# Patient Record
Sex: Male | Born: 1940 | Race: White | Hispanic: No | Smoking: Current every day smoker
Health system: Southern US, Community
[De-identification: ages and names within clinical notes are randomized; demographics above are authoritative.]

## PROBLEM LIST (undated history)

## (undated) DIAGNOSIS — E785 Hyperlipidemia, unspecified: Secondary | ICD-10-CM

## (undated) DIAGNOSIS — R51 Headache: Secondary | ICD-10-CM

## (undated) DIAGNOSIS — I10 Essential (primary) hypertension: Secondary | ICD-10-CM

## (undated) DIAGNOSIS — C801 Malignant (primary) neoplasm, unspecified: Secondary | ICD-10-CM

## (undated) DIAGNOSIS — L409 Psoriasis, unspecified: Secondary | ICD-10-CM

## (undated) DIAGNOSIS — I639 Cerebral infarction, unspecified: Secondary | ICD-10-CM

## (undated) HISTORY — DX: Hyperlipidemia, unspecified: E78.5

## (undated) HISTORY — DX: Cerebral infarction, unspecified: I63.9

## (undated) HISTORY — DX: Essential (primary) hypertension: I10

## (undated) HISTORY — PX: HERNIA REPAIR: SHX51

---

## 2000-01-30 DIAGNOSIS — I639 Cerebral infarction, unspecified: Secondary | ICD-10-CM

## 2000-01-30 HISTORY — DX: Cerebral infarction, unspecified: I63.9

## 2001-02-04 ENCOUNTER — Encounter: Payer: Self-pay | Admitting: Family Medicine

## 2001-02-04 ENCOUNTER — Ambulatory Visit (HOSPITAL_COMMUNITY): Admission: RE | Admit: 2001-02-04 | Discharge: 2001-02-04 | Payer: Self-pay | Admitting: Family Medicine

## 2002-03-13 ENCOUNTER — Encounter: Payer: Self-pay | Admitting: General Surgery

## 2002-03-19 ENCOUNTER — Observation Stay (HOSPITAL_COMMUNITY): Admission: RE | Admit: 2002-03-19 | Discharge: 2002-03-20 | Payer: Self-pay | Admitting: General Surgery

## 2002-06-08 ENCOUNTER — Encounter: Payer: Self-pay | Admitting: Family Medicine

## 2002-06-08 ENCOUNTER — Ambulatory Visit (HOSPITAL_COMMUNITY): Admission: RE | Admit: 2002-06-08 | Discharge: 2002-06-08 | Payer: Self-pay | Admitting: Family Medicine

## 2008-04-15 ENCOUNTER — Encounter: Payer: Self-pay | Admitting: Cardiology

## 2008-04-19 ENCOUNTER — Ambulatory Visit (HOSPITAL_COMMUNITY): Admission: RE | Admit: 2008-04-19 | Discharge: 2008-04-19 | Payer: Self-pay | Admitting: Family Medicine

## 2010-06-16 NOTE — Op Note (Signed)
NAME:  Gregory Santana, Gregory Santana NO.:  [id number]   MEDICAL RECORD NO.:  [id number]                   PATIENT TYPE:  AMB   LOCATION:  DAY                                  FACILITY:  [facility]   PHYSICIAN:  Timothy E. Earlene Plater, M.D.              DATE OF BIRTH:  10-23-1940   DATE OF PROCEDURE:  03/19/2002  DATE OF DISCHARGE:                                 OPERATIVE REPORT   PREOPERATIVE DIAGNOSIS:  Incarcerated left inguinal hernia.   POSTOPERATIVE DIAGNOSIS:  Incarcerated giant left direct inguinal hernia.   OPERATIVE PROCEDURE:  Dissection of the left groin with reduction of hernia  and repair of hernia defect.   SURGEON:  Timothy E. Earlene Plater, M.D.   ASSISTANT:  Gita Kudo, M.D.   ANESTHESIA:  CRNA supervised M.D.   INDICATIONS FOR PROCEDURE:  [name] presents for repair of inguinal  hernia.  This has been present for years.  It has been incarcerated for  years.  He has significant risk factors with cardiovascular disease, COPD,  obesity, and enormous abdominal girth.  He agrees and understands that these  risk factors are considered in the repair of the hernia, and he wishes to  proceed.  It is necessary to have a second surgeon assist on this case.   DESCRIPTION OF PROCEDURE:  The patient was identified, the permit signed,  and evaluated by anesthesia.  He was taken to the operating room, placed  supine, general endotracheal anesthesia administered.  The left groin was  further prepped, shaved, prepped, and draped in the usual fashion.  The  entire scrotum and genitalia were draped out with the left lower quadrant.  Marcaine 25% was used preoperatively.  An extended incision was made  diagonally across the left groin from the top of the scrotum to near the  iliac crest.  Subcutaneous tissue dissected and retracted.  The hernia mass  identified in the subcu space.  With two surgeons retracting and an  additional scrub tech retracting, the mass could be  separated from the  subcutaneous tissue.  The testicle was delivered up into the field with the  attached herniated mass, and by careful blunt and sharp dissection the  testicle and the cord structures were reflected laterally and bluntly  dissected from the hernia mass with success.  There appeared to be no harm  to the testicle or cord structures.  The hernia mass was then completely  dissected off the cord, back to the internal ring.  The internal ring could  be defined with proper traction and with the help there, we could actually  bluntly dissect the entire internal ring.  So with the cord retracted  laterally, the hernia sac was gently and carefully massaged.  The patient  was placed in the head-down position, and the entire herniated mass was  finally able to be placed back into the abdominal cavity without opening the  sac.  This was accomplished.  The internal ring was then further bluntly  dissected.  We elected to do a two layer mesh.  We took a piece of flat  mesh, cut and designed a wing to place beneath the internal ring and the  preperitoneal space.  This was sutured with anchor sutures of 0 Prolene.  The cord structures exited lateral to the hernia repair.  The cord was  checked, the testicle replaced into the left hemiscrotum.  Cord structures  kept laterally, and then a second piece of mesh was cut and fashioned to fit  the entire floor of the canal from the symphysis pubis along the external  oblique, along the medial aspect of the transversalis and rectus sheath, up  to and around the internal ring.  This was likewise sutured with interrupted  0 Prolene sutures and secured the final repair.  The cord structures were  viable, the testicles viable.  It was kept in the left hemiscrotum, and the  cord structures were placed in the floor of the canal.  The external oblique  closed with a running 2-0 Vicryl.  Bleeding had been controlled with the cautery, and the wound was  dry.  Irrigation was clear.  Counts were all correct.  The subcu was approximated  with 2-0 Vicryl, and the skin was closed with wide skin stapled.  Final  counts correct.  He tolerated it very well, and he was awakened, extubated,  and taken to the recovery room.                                               Timothy E. Earlene Plater, M.D.    TED/MEDQ  D:  03/19/2002  T:  03/19/2002  Job:  [id number]

## 2010-08-30 ENCOUNTER — Ambulatory Visit: Payer: Self-pay | Admitting: Cardiology

## 2010-10-05 ENCOUNTER — Ambulatory Visit: Payer: Self-pay | Admitting: Cardiology

## 2011-03-06 ENCOUNTER — Ambulatory Visit (HOSPITAL_COMMUNITY)
Admission: RE | Admit: 2011-03-06 | Discharge: 2011-03-06 | Disposition: A | Discharge status: Home / Self Care | Payer: Medicare PPO | Source: Ambulatory Visit | Attending: Family Medicine | Admitting: Family Medicine

## 2011-03-06 ENCOUNTER — Other Ambulatory Visit: Payer: Self-pay | Admitting: Family Medicine

## 2011-03-06 DIAGNOSIS — R52 Pain, unspecified: Secondary | ICD-10-CM

## 2011-03-06 DIAGNOSIS — M79609 Pain in unspecified limb: Secondary | ICD-10-CM | POA: Insufficient documentation

## 2011-03-06 DIAGNOSIS — M7989 Other specified soft tissue disorders: Secondary | ICD-10-CM | POA: Insufficient documentation

## 2011-03-08 ENCOUNTER — Other Ambulatory Visit: Payer: Self-pay | Admitting: Family Medicine

## 2011-03-08 DIAGNOSIS — M7989 Other specified soft tissue disorders: Secondary | ICD-10-CM

## 2011-03-08 DIAGNOSIS — R7989 Other specified abnormal findings of blood chemistry: Secondary | ICD-10-CM

## 2011-03-13 ENCOUNTER — Ambulatory Visit (HOSPITAL_COMMUNITY)
Admission: RE | Admit: 2011-03-13 | Discharge: 2011-03-13 | Disposition: A | Discharge status: Home / Self Care | Payer: Medicare PPO | Source: Ambulatory Visit | Attending: Family Medicine | Admitting: Family Medicine

## 2011-03-13 DIAGNOSIS — R7989 Other specified abnormal findings of blood chemistry: Secondary | ICD-10-CM

## 2011-03-13 DIAGNOSIS — M7989 Other specified soft tissue disorders: Secondary | ICD-10-CM | POA: Insufficient documentation

## 2011-03-13 DIAGNOSIS — M79609 Pain in unspecified limb: Secondary | ICD-10-CM | POA: Insufficient documentation

## 2012-05-04 ENCOUNTER — Other Ambulatory Visit: Payer: Self-pay | Admitting: Family Medicine

## 2012-06-03 ENCOUNTER — Other Ambulatory Visit: Payer: Self-pay | Admitting: Family Medicine

## 2012-06-03 NOTE — Telephone Encounter (Signed)
May refill each times 3

## 2012-06-05 ENCOUNTER — Other Ambulatory Visit: Payer: Self-pay | Admitting: Family Medicine

## 2012-06-05 ENCOUNTER — Telehealth: Payer: Self-pay | Admitting: Family Medicine

## 2012-06-05 NOTE — Telephone Encounter (Signed)
Seen in office 09/07/11

## 2012-06-05 NOTE — Telephone Encounter (Signed)
Patient needs a refill of his Ativan to [facility], today please

## 2012-06-06 NOTE — Telephone Encounter (Signed)
Med called into [facility]. Patient notified and will call back to schedule office visit.

## 2012-06-06 NOTE — Telephone Encounter (Signed)
Ok times 1 needs ov 

## 2012-06-06 NOTE — Telephone Encounter (Signed)
Refill x1 he needs an office visit

## 2012-06-11 ENCOUNTER — Encounter: Payer: Self-pay | Admitting: *Deleted

## 2012-07-02 ENCOUNTER — Ambulatory Visit (INDEPENDENT_AMBULATORY_CARE_PROVIDER_SITE_OTHER): Payer: Medicare PPO | Admitting: Family Medicine

## 2012-07-02 ENCOUNTER — Encounter: Payer: Self-pay | Admitting: Family Medicine

## 2012-07-02 VITALS — BP 119/77 | HR 80 | Wt 273.0 lb

## 2012-07-02 DIAGNOSIS — R0989 Other specified symptoms and signs involving the circulatory and respiratory systems: Secondary | ICD-10-CM

## 2012-07-02 DIAGNOSIS — I10 Essential (primary) hypertension: Secondary | ICD-10-CM | POA: Insufficient documentation

## 2012-07-02 DIAGNOSIS — L408 Other psoriasis: Secondary | ICD-10-CM

## 2012-07-02 DIAGNOSIS — E785 Hyperlipidemia, unspecified: Secondary | ICD-10-CM

## 2012-07-02 DIAGNOSIS — L409 Psoriasis, unspecified: Secondary | ICD-10-CM | POA: Insufficient documentation

## 2012-07-02 MED ORDER — IBUPROFEN 600 MG PO TABS: 600.0000 mg | ORAL_TABLET | Freq: Four times a day (QID) | ORAL | Status: DC | PRN

## 2012-07-02 MED ORDER — LISINOPRIL 5 MG PO TABS: 5.0000 mg | ORAL_TABLET | Freq: Every day | ORAL | Status: DC

## 2012-07-02 MED ORDER — ATORVASTATIN CALCIUM 20 MG PO TABS: 20.0000 mg | ORAL_TABLET | Freq: Every day | ORAL | Status: DC

## 2012-07-02 MED ORDER — TAMSULOSIN HCL 0.4 MG PO CAPS: 0.4000 mg | ORAL_CAPSULE | Freq: Every day | ORAL | Status: DC

## 2012-07-02 MED ORDER — LORAZEPAM 0.5 MG PO TABS: 0.5000 mg | ORAL_TABLET | Freq: Every evening | ORAL | Status: DC | PRN

## 2012-07-02 MED ORDER — IBUPROFEN 200 MG PO TABS: ORAL_TABLET | ORAL | Status: DC

## 2012-07-02 MED ORDER — MOMETASONE FUROATE 0.1 % EX CREA: TOPICAL_CREAM | CUTANEOUS | Status: DC

## 2012-07-02 NOTE — Progress Notes (Signed)
Pt has app [facility] July 08, 2012 1000 for carotid u/s

## 2012-07-02 NOTE — Patient Instructions (Signed)
Cut down on BP medicine- use 1/2 daily  Lisinopril-HCTZ 10-12.5  Carotid ultrasound in near future

## 2012-07-02 NOTE — Progress Notes (Signed)
  Subjective:    Patient ID: Gregory Santana, male    DOB: 1941-01-12, 72 y.o.   MRN: [id number]  Rash  Noticed lesions on abdomen,back, right and left legs 3 months ago. Began small 3 months ago and has worsened. Pt states itches and bleeds when scratching.   Patient states that his son has psoriasis he also states he has also noticed increased scaling in his scalp as well.  Patient has history hypertension he relates that when he gets up he feels dizzy and woozy this been present over the past few months he has had some intermittent dizziness has a history of a stroke and carotid artery disease as well as hypertension  He does try to keep healthy and tries to watch her fats in his diet to some degree but he does not exercise in he smokes approximately a pack and a half a day. He states that he would like to quit but he really at the same time doesn't think he is going to be able to.  PMH  Psoriasis, hypertension, stroke, heart disease, hyperlipidemia Family history noncontributory Social smokes   Review of Systems  Skin: Positive for rash.  Denies chest pressure tightness pain and swelling in the legs denies fevers chills wheezing.     Objective:   Physical Exam  Lungs are clear, heart is regular, pulses normal, abdomen is soft psoriasis noted on the scan carotid bruit worse on the left than the right extremities no edema skin warm dry      Assessment & Plan:  Psoriasis-Elocon cream twice a day when necessary also use medicated shampoo over-the-counter Hypertension-medications slightly strong for him. Reduce lisinopril 5 mg 1 daily. Patient's blood pressure does drop dramatically from sitting to standing. Followup again in approximately 3-4 months Tobacco abuse advised patient to quit smoking counseled him to do so Hyperlipidemia check lipid profile continue medication watch diet Insomnia -- she does well with lorazepam at nighttime continue this BPH does well with Flomax  continue this. Carotid bruit-carotid ultrasound ordered. Await results.

## 2012-07-08 ENCOUNTER — Ambulatory Visit (HOSPITAL_COMMUNITY)
Admission: RE | Admit: 2012-07-08 | Discharge: 2012-07-08 | Disposition: A | Discharge status: Home / Self Care | Payer: Medicare PPO | Source: Ambulatory Visit | Attending: Family Medicine | Admitting: Family Medicine

## 2012-07-08 DIAGNOSIS — R0989 Other specified symptoms and signs involving the circulatory and respiratory systems: Secondary | ICD-10-CM | POA: Insufficient documentation

## 2012-07-08 DIAGNOSIS — I6529 Occlusion and stenosis of unspecified carotid artery: Secondary | ICD-10-CM | POA: Insufficient documentation

## 2012-07-08 LAB — LIPID PANEL: LDL Cholesterol: 41 mg/dL (ref 0–99)

## 2012-07-08 LAB — BASIC METABOLIC PANEL
BUN: 29 mg/dL — ABNORMAL HIGH (ref 6–23)
CO2: 27 mEq/L (ref 19–32)
Calcium: 9.3 mg/dL (ref 8.4–10.5)
Chloride: 104 mEq/L (ref 96–112)
Creat: 1.58 mg/dL — ABNORMAL HIGH (ref 0.50–1.35)

## 2012-10-08 ENCOUNTER — Ambulatory Visit: Payer: Medicare PPO | Admitting: Family Medicine

## 2012-12-02 ENCOUNTER — Other Ambulatory Visit: Payer: Self-pay | Admitting: Family Medicine

## 2012-12-03 NOTE — Telephone Encounter (Signed)
1 refill, he needs a followup office visit!

## 2013-01-03 ENCOUNTER — Other Ambulatory Visit: Payer: Self-pay | Admitting: Family Medicine

## 2013-01-05 ENCOUNTER — Encounter: Payer: Self-pay | Admitting: Family Medicine

## 2013-01-05 ENCOUNTER — Ambulatory Visit (INDEPENDENT_AMBULATORY_CARE_PROVIDER_SITE_OTHER): Payer: Medicare PPO | Admitting: Family Medicine

## 2013-01-05 VITALS — BP 160/88 | Temp 98.0°F | Ht 73.0 in | Wt 273.0 lb

## 2013-01-05 DIAGNOSIS — N320 Bladder-neck obstruction: Secondary | ICD-10-CM

## 2013-01-05 DIAGNOSIS — Z125 Encounter for screening for malignant neoplasm of prostate: Secondary | ICD-10-CM

## 2013-01-05 DIAGNOSIS — R350 Frequency of micturition: Secondary | ICD-10-CM

## 2013-01-05 DIAGNOSIS — Z79899 Other long term (current) drug therapy: Secondary | ICD-10-CM

## 2013-01-05 MED ORDER — DOXAZOSIN MESYLATE 4 MG PO TABS: 4.0000 mg | ORAL_TABLET | Freq: Every day | ORAL | Status: DC

## 2013-01-05 NOTE — Progress Notes (Signed)
   Subjective:    Patient ID: AARISH ROCKERS, male    DOB: 15-Aug-1940, 72 y.o.   MRN: [id number]  HPIFrequent urination for the past 2-3 weeks. Was seen a few months ago for this. Stopped taking flomax because he didn't see where it was helping.  He has noticed this problem over the past several weeks. Some constipation as well. He has been sleeping in a recliner. Patient stopped taking Flomax was he didn't think it was helping. Review of Systems He denies high fevers denies wheezing difficulty breathing denies nausea vomiting diarrhea.    Objective:   Physical Exam  Lungs are clear hearts regular pulse normal blood pressure mildly elevated. 154/86. Extremities some trace edema.      Assessment & Plan:  Patient with constipation stool softeners-we'll discuss this further on followup. May need referral for colonoscopy  HTN start Cardura 4 mg each bedtime  BPH-prostate felt fine. He will bring by a urine for [place] to check. PSA ordered along with kidney functions. Referral to [facility]. May need to have prostate resection done.

## 2013-01-06 ENCOUNTER — Other Ambulatory Visit: Payer: Self-pay | Admitting: *Deleted

## 2013-01-06 DIAGNOSIS — R350 Frequency of micturition: Secondary | ICD-10-CM

## 2013-01-06 LAB — POCT URINALYSIS DIPSTICK: Spec Grav, UA: 1.025

## 2013-01-06 LAB — BASIC METABOLIC PANEL
CO2: 27 mEq/L (ref 19–32)
Chloride: 104 mEq/L (ref 96–112)
Glucose, Bld: 92 mg/dL (ref 70–99)
Potassium: 4.8 mEq/L (ref 3.5–5.3)
Sodium: 143 mEq/L (ref 135–145)

## 2013-01-06 NOTE — Progress Notes (Signed)
Patient notified

## 2013-01-06 NOTE — Addendum Note (Signed)
Addended by: Metro Kung on: 01/06/2013 04:20 PM   Modules accepted: Orders

## 2013-01-07 ENCOUNTER — Other Ambulatory Visit: Payer: Self-pay | Admitting: *Deleted

## 2013-01-07 MED ORDER — CIPROFLOXACIN HCL 500 MG PO TABS: 500.0000 mg | ORAL_TABLET | Freq: Two times a day (BID) | ORAL | Status: DC

## 2013-01-07 NOTE — Progress Notes (Signed)
Patient's daughter notified.

## 2013-01-07 NOTE — Progress Notes (Signed)
[facility] 12/10 (Cipro was ordered)

## 2013-01-10 LAB — URINE CULTURE: Colony Count: >=100000

## 2013-01-14 ENCOUNTER — Encounter: Payer: Self-pay | Admitting: Family Medicine

## 2013-01-14 ENCOUNTER — Telehealth: Payer: Self-pay | Admitting: Family Medicine

## 2013-01-14 NOTE — Telephone Encounter (Signed)
Patients daughter cancelled appt for Monday because he is going to see the specialist for his issue. His daughter wants to know if he still needs to followup with [place]?

## 2013-01-14 NOTE — Telephone Encounter (Signed)
Discussed with daughter. She canceled Monday's appt and she will call back to schedule office visit in January.

## 2013-01-14 NOTE — Telephone Encounter (Signed)
The cardura is to help his urine flow and HTN, he can cancel Mondays OV BUT I rec ov in Jan to recheck BP

## 2013-01-19 ENCOUNTER — Ambulatory Visit: Payer: Medicare PPO | Admitting: Family Medicine

## 2013-01-21 ENCOUNTER — Encounter: Payer: Self-pay | Admitting: Family Medicine

## 2013-01-21 DIAGNOSIS — C679 Malignant neoplasm of bladder, unspecified: Secondary | ICD-10-CM | POA: Insufficient documentation

## 2013-01-29 DIAGNOSIS — L409 Psoriasis, unspecified: Secondary | ICD-10-CM

## 2013-01-29 HISTORY — DX: Psoriasis, unspecified: L40.9

## 2013-01-31 ENCOUNTER — Other Ambulatory Visit: Payer: Self-pay | Admitting: Family Medicine

## 2013-02-02 ENCOUNTER — Other Ambulatory Visit: Payer: Self-pay | Admitting: Urology

## 2013-02-03 ENCOUNTER — Encounter (HOSPITAL_COMMUNITY): Payer: Self-pay | Admitting: Pharmacy Technician

## 2013-02-04 ENCOUNTER — Encounter (HOSPITAL_COMMUNITY): Payer: Self-pay

## 2013-02-04 ENCOUNTER — Other Ambulatory Visit (HOSPITAL_COMMUNITY): Payer: Self-pay | Admitting: Urology

## 2013-02-04 NOTE — Patient Instructions (Addendum)
[place]  02/04/2013   Your procedure is scheduled on:  02/06/13 FRIDAY  Report to [facility] at   0900    AM.  Call this number if you have problems the morning of surgery: [phone]       Remember:   Do not eat food  Or drink :After Midnight.THURSDAY NIGHT   Take these medicines the morning of surgery with A SIP OF WATER: NONE   .  Contacts, dentures or partial plates can not be worn to surgery  Leave suitcase in the car. After surgery it may be brought to your room.  For patients admitted to the hospital, checkout time is 11:00 AM day of  discharge.             SPECIAL INSTRUCTIONS- SEE [facility] PREPARING FOR SURGERY INSTRUCTION SHEET-     DO NOT WEAR JEWELRY, LOTIONS, POWDERS, OR PERFUMES.  WOMEN-- DO NOT SHAVE LEGS OR UNDERARMS FOR 12 HOURS BEFORE SHOWERS. MEN MAY SHAVE FACE.  Patients discharged the day of surgery will not be allowed to drive home. IF going home the day of surgery, you must have a driver and someone to stay with you for the first 24 hours  Name and phone number of your driver:    Overnight stay                                                                                                                  Shaunie Boehm  PST [phone]                 FAILURE TO [place]                                                  Patient Signature _____________________________

## 2013-02-05 ENCOUNTER — Encounter (HOSPITAL_COMMUNITY): Payer: Self-pay

## 2013-02-05 ENCOUNTER — Encounter (HOSPITAL_COMMUNITY)
Admission: RE | Admit: 2013-02-05 | Discharge: 2013-02-05 | Disposition: A | Discharge status: Home / Self Care | Payer: Medicare PPO | Source: Ambulatory Visit | Attending: Urology | Admitting: Urology

## 2013-02-05 ENCOUNTER — Ambulatory Visit (HOSPITAL_COMMUNITY)
Admission: RE | Admit: 2013-02-05 | Discharge: 2013-02-05 | Disposition: A | Discharge status: Home / Self Care | Payer: Medicare PPO | Source: Ambulatory Visit | Attending: Urology | Admitting: Urology

## 2013-02-05 HISTORY — DX: Malignant (primary) neoplasm, unspecified: C80.1

## 2013-02-05 HISTORY — DX: Headache: R51

## 2013-02-05 HISTORY — DX: Psoriasis, unspecified: L40.9

## 2013-02-05 LAB — CBC
HCT: 40.1 % (ref 39.0–52.0)
Hemoglobin: 13.7 g/dL (ref 13.0–17.0)
MCH: 31.9 pg (ref 26.0–34.0)
MCHC: 34.2 g/dL (ref 30.0–36.0)
MCV: 93.5 fL (ref 78.0–100.0)
PLATELETS: 350 10*3/uL (ref 150–400)
RBC: 4.29 MIL/uL (ref 4.22–5.81)
RDW: 12.6 % (ref 11.5–15.5)
WBC: 13.4 10*3/uL — ABNORMAL HIGH (ref 4.0–10.5)

## 2013-02-05 LAB — BASIC METABOLIC PANEL
BUN: 25 mg/dL — ABNORMAL HIGH (ref 6–23)
CALCIUM: 9.8 mg/dL (ref 8.4–10.5)
CO2: 26 mEq/L (ref 19–32)
CREATININE: 1.93 mg/dL — AB (ref 0.50–1.35)
Chloride: 102 mEq/L (ref 96–112)
GFR calc Af Amer: 39 mL/min — ABNORMAL LOW (ref >90)
GFR calc non Af Amer: 34 mL/min — ABNORMAL LOW (ref >90)
GLUCOSE: 119 mg/dL — AB (ref 70–99)
Potassium: 4.9 mEq/L (ref 3.7–5.3)
Sodium: 138 mEq/L (ref 137–147)

## 2013-02-05 MED ORDER — DEXTROSE 5 % IV SOLN
1.0000 g | Freq: Once | INTRAVENOUS | Status: AC
  Administered 2013-02-06: 1 g via INTRAVENOUS
  Filled 2013-02-05 (×2): qty 10

## 2013-02-05 NOTE — Progress Notes (Signed)
Faxed labs to Dr Tresa Moore via [facility]

## 2013-02-05 NOTE — Progress Notes (Signed)
EKG reviewed by Dr  Marcell Barlow- "OK"

## 2013-02-06 ENCOUNTER — Encounter (HOSPITAL_COMMUNITY): Payer: Self-pay | Admitting: *Deleted

## 2013-02-06 ENCOUNTER — Inpatient Hospital Stay (HOSPITAL_COMMUNITY)
Admission: RE | Admit: 2013-02-06 | Discharge: 2013-02-09 | DRG: 669 | Disposition: A | Discharge status: Home / Self Care | Payer: Medicare PPO | Source: Ambulatory Visit | Attending: Urology | Admitting: Urology

## 2013-02-06 ENCOUNTER — Ambulatory Visit (HOSPITAL_COMMUNITY): Payer: Medicare PPO | Admitting: Certified Registered Nurse Anesthetist

## 2013-02-06 ENCOUNTER — Encounter (HOSPITAL_COMMUNITY)
Admission: RE | Disposition: A | Discharge status: Home / Self Care | Payer: Self-pay | Source: Ambulatory Visit | Attending: Urology

## 2013-02-06 ENCOUNTER — Encounter (HOSPITAL_COMMUNITY): Payer: Medicare PPO | Admitting: Certified Registered Nurse Anesthetist

## 2013-02-06 DIAGNOSIS — Z8673 Personal history of transient ischemic attack (TIA), and cerebral infarction without residual deficits: Secondary | ICD-10-CM

## 2013-02-06 DIAGNOSIS — N1330 Unspecified hydronephrosis: Secondary | ICD-10-CM | POA: Diagnosis present

## 2013-02-06 DIAGNOSIS — F17200 Nicotine dependence, unspecified, uncomplicated: Secondary | ICD-10-CM | POA: Diagnosis present

## 2013-02-06 DIAGNOSIS — E669 Obesity, unspecified: Secondary | ICD-10-CM | POA: Diagnosis present

## 2013-02-06 DIAGNOSIS — L408 Other psoriasis: Secondary | ICD-10-CM | POA: Diagnosis present

## 2013-02-06 DIAGNOSIS — I10 Essential (primary) hypertension: Secondary | ICD-10-CM | POA: Diagnosis present

## 2013-02-06 DIAGNOSIS — R3989 Other symptoms and signs involving the genitourinary system: Secondary | ICD-10-CM | POA: Diagnosis present

## 2013-02-06 DIAGNOSIS — Z01812 Encounter for preprocedural laboratory examination: Secondary | ICD-10-CM

## 2013-02-06 DIAGNOSIS — Z01818 Encounter for other preprocedural examination: Secondary | ICD-10-CM

## 2013-02-06 DIAGNOSIS — E785 Hyperlipidemia, unspecified: Secondary | ICD-10-CM | POA: Diagnosis present

## 2013-02-06 DIAGNOSIS — Z01810 Encounter for preprocedural cardiovascular examination: Secondary | ICD-10-CM

## 2013-02-06 DIAGNOSIS — D649 Anemia, unspecified: Secondary | ICD-10-CM | POA: Diagnosis present

## 2013-02-06 DIAGNOSIS — R39198 Other difficulties with micturition: Secondary | ICD-10-CM | POA: Diagnosis present

## 2013-02-06 DIAGNOSIS — N138 Other obstructive and reflux uropathy: Secondary | ICD-10-CM | POA: Diagnosis present

## 2013-02-06 DIAGNOSIS — Z8249 Family history of ischemic heart disease and other diseases of the circulatory system: Secondary | ICD-10-CM

## 2013-02-06 DIAGNOSIS — N401 Enlarged prostate with lower urinary tract symptoms: Secondary | ICD-10-CM | POA: Diagnosis present

## 2013-02-06 DIAGNOSIS — C679 Malignant neoplasm of bladder, unspecified: Principal | ICD-10-CM | POA: Diagnosis present

## 2013-02-06 DIAGNOSIS — Z6834 Body mass index (BMI) 34.0-34.9, adult: Secondary | ICD-10-CM

## 2013-02-06 DIAGNOSIS — N3941 Urge incontinence: Secondary | ICD-10-CM | POA: Diagnosis present

## 2013-02-06 DIAGNOSIS — R310 Gross hematuria: Secondary | ICD-10-CM | POA: Diagnosis present

## 2013-02-06 HISTORY — PX: CYSTOSCOPY W/ URETERAL STENT PLACEMENT: SHX1429

## 2013-02-06 HISTORY — PX: CYSTOGRAM: SHX6285

## 2013-02-06 HISTORY — PX: TRANSURETHRAL RESECTION OF BLADDER TUMOR WITH GYRUS (TURBT-GYRUS): SHX6458

## 2013-02-06 LAB — BASIC METABOLIC PANEL
BUN: 26 mg/dL — AB (ref 6–23)
CHLORIDE: 104 meq/L (ref 96–112)
CO2: 22 mEq/L (ref 19–32)
Calcium: 8.1 mg/dL — ABNORMAL LOW (ref 8.4–10.5)
Creatinine, Ser: 2.05 mg/dL — ABNORMAL HIGH (ref 0.50–1.35)
GFR calc Af Amer: 36 mL/min — ABNORMAL LOW (ref >90)
GFR calc non Af Amer: 31 mL/min — ABNORMAL LOW (ref >90)
GLUCOSE: 152 mg/dL — AB (ref 70–99)
POTASSIUM: 4.7 meq/L (ref 3.7–5.3)
Sodium: 139 mEq/L (ref 137–147)

## 2013-02-06 LAB — HEMOGLOBIN AND HEMATOCRIT, BLOOD
HCT: 30.2 % — ABNORMAL LOW (ref 39.0–52.0)
Hemoglobin: 10 g/dL — ABNORMAL LOW (ref 13.0–17.0)

## 2013-02-06 LAB — ABO/RH: ABO/RH(D): B POS

## 2013-02-06 LAB — PREPARE RBC (CROSSMATCH)

## 2013-02-06 SURGERY — TRANSURETHRAL RESECTION OF BLADDER TUMOR WITH GYRUS (TURBT-GYRUS)
Anesthesia: General | Laterality: Right

## 2013-02-06 MED ORDER — EPHEDRINE SULFATE 50 MG/ML IJ SOLN
INTRAMUSCULAR | Status: DC | PRN
  Administered 2013-02-06 (×4): 10 mg via INTRAVENOUS
  Administered 2013-02-06: 20 mg via INTRAVENOUS
  Administered 2013-02-06 (×3): 10 mg via INTRAVENOUS

## 2013-02-06 MED ORDER — FENTANYL CITRATE 0.05 MG/ML IJ SOLN: 25.0000 ug | INTRAMUSCULAR | Status: DC | PRN

## 2013-02-06 MED ORDER — 0.9 % SODIUM CHLORIDE (POUR BTL) OPTIME
TOPICAL | Status: DC | PRN
  Administered 2013-02-06: 1000 mL

## 2013-02-06 MED ORDER — PHENYLEPHRINE HCL 10 MG/ML IJ SOLN
INTRAMUSCULAR | Status: DC | PRN
  Administered 2013-02-06 (×3): 80 ug via INTRAVENOUS

## 2013-02-06 MED ORDER — ROCURONIUM BROMIDE 100 MG/10ML IV SOLN
INTRAVENOUS | Status: DC | PRN
  Administered 2013-02-06 (×2): 10 mg via INTRAVENOUS
  Administered 2013-02-06: 30 mg via INTRAVENOUS

## 2013-02-06 MED ORDER — LORAZEPAM 0.5 MG PO TABS: 0.5000 mg | ORAL_TABLET | Freq: Every evening | ORAL | Status: DC | PRN

## 2013-02-06 MED ORDER — LACTATED RINGERS IV SOLN
INTRAVENOUS | Status: AC
  Administered 2013-02-06 (×3): 1000 mL via INTRAVENOUS

## 2013-02-06 MED ORDER — HYDROMORPHONE HCL PF 1 MG/ML IJ SOLN
0.5000 mg | INTRAMUSCULAR | Status: DC | PRN
  Administered 2013-02-06 – 2013-02-07 (×2): 1 mg via INTRAVENOUS
  Filled 2013-02-06: qty 1

## 2013-02-06 MED ORDER — FENTANYL CITRATE 0.05 MG/ML IJ SOLN
INTRAMUSCULAR | Status: AC
  Filled 2013-02-06: qty 2

## 2013-02-06 MED ORDER — ATORVASTATIN CALCIUM 20 MG PO TABS
20.0000 mg | ORAL_TABLET | Freq: Every day | ORAL | Status: DC
  Administered 2013-02-06 – 2013-02-08 (×3): 20 mg via ORAL
  Filled 2013-02-06 (×4): qty 1

## 2013-02-06 MED ORDER — MIDAZOLAM HCL 5 MG/5ML IJ SOLN
INTRAMUSCULAR | Status: DC | PRN
  Administered 2013-02-06: 2 mg via INTRAVENOUS

## 2013-02-06 MED ORDER — GLYCOPYRROLATE 0.2 MG/ML IJ SOLN
INTRAMUSCULAR | Status: DC | PRN
  Administered 2013-02-06: 0.6 mg via INTRAVENOUS

## 2013-02-06 MED ORDER — ONDANSETRON HCL 4 MG/2ML IJ SOLN
INTRAMUSCULAR | Status: DC | PRN
Start: 2013-02-06 — End: 2013-02-06
  Administered 2013-02-06: 4 mg via INTRAVENOUS

## 2013-02-06 MED ORDER — PROPOFOL 10 MG/ML IV BOLUS
INTRAVENOUS | Status: AC
  Filled 2013-02-06: qty 20

## 2013-02-06 MED ORDER — EPHEDRINE SULFATE 50 MG/ML IJ SOLN
INTRAMUSCULAR | Status: AC
  Filled 2013-02-06: qty 1

## 2013-02-06 MED ORDER — PHENYLEPHRINE HCL 10 MG/ML IJ SOLN
INTRAMUSCULAR | Status: AC
  Filled 2013-02-06: qty 1

## 2013-02-06 MED ORDER — LIDOCAINE HCL (CARDIAC) 20 MG/ML IV SOLN
INTRAVENOUS | Status: AC
  Filled 2013-02-06: qty 5

## 2013-02-06 MED ORDER — SENNA 8.6 MG PO TABS
1.0000 | ORAL_TABLET | Freq: Two times a day (BID) | ORAL | Status: DC
  Administered 2013-02-06 – 2013-02-09 (×5): 8.6 mg via ORAL
  Filled 2013-02-06 (×5): qty 1

## 2013-02-06 MED ORDER — NEOSTIGMINE METHYLSULFATE 1 MG/ML IJ SOLN
INTRAMUSCULAR | Status: DC | PRN
  Administered 2013-02-06: 5 mg via INTRAVENOUS

## 2013-02-06 MED ORDER — PHENYLEPHRINE HCL 10 MG/ML IJ SOLN
10.0000 mg | INTRAVENOUS | Status: DC | PRN
  Administered 2013-02-06: 25 ug/min via INTRAVENOUS

## 2013-02-06 MED ORDER — ONDANSETRON HCL 4 MG/2ML IJ SOLN
INTRAMUSCULAR | Status: AC
  Filled 2013-02-06: qty 2

## 2013-02-06 MED ORDER — DEXAMETHASONE SODIUM PHOSPHATE 10 MG/ML IJ SOLN
INTRAMUSCULAR | Status: DC | PRN
  Administered 2013-02-06: 10 mg via INTRAVENOUS

## 2013-02-06 MED ORDER — GLYCOPYRROLATE 0.2 MG/ML IJ SOLN
INTRAMUSCULAR | Status: AC
  Filled 2013-02-06: qty 3

## 2013-02-06 MED ORDER — PROPOFOL 10 MG/ML IV BOLUS
INTRAVENOUS | Status: DC | PRN
  Administered 2013-02-06: 200 mg via INTRAVENOUS

## 2013-02-06 MED ORDER — SODIUM CHLORIDE 0.9 % IJ SOLN
INTRAMUSCULAR | Status: AC
  Filled 2013-02-06: qty 10

## 2013-02-06 MED ORDER — SODIUM CHLORIDE 0.9 % IR SOLN
Status: DC | PRN
  Administered 2013-02-06: 37000 mL

## 2013-02-06 MED ORDER — ACETAMINOPHEN 500 MG PO TABS
1000.0000 mg | ORAL_TABLET | Freq: Four times a day (QID) | ORAL | Status: AC
  Administered 2013-02-06 – 2013-02-07 (×4): 1000 mg via ORAL
  Filled 2013-02-06 (×4): qty 2

## 2013-02-06 MED ORDER — OXYCODONE HCL 5 MG PO TABS
5.0000 mg | ORAL_TABLET | ORAL | Status: DC | PRN
  Administered 2013-02-07 – 2013-02-09 (×2): 5 mg via ORAL
  Filled 2013-02-06 (×2): qty 1

## 2013-02-06 MED ORDER — KCL IN DEXTROSE-NACL 20-5-0.45 MEQ/L-%-% IV SOLN
INTRAVENOUS | Status: DC
  Administered 2013-02-06 – 2013-02-08 (×5): via INTRAVENOUS
  Filled 2013-02-06 (×7): qty 1000

## 2013-02-06 MED ORDER — MIDAZOLAM HCL 2 MG/2ML IJ SOLN
INTRAMUSCULAR | Status: AC
  Filled 2013-02-06: qty 2

## 2013-02-06 MED ORDER — DOCUSATE SODIUM 100 MG PO CAPS
100.0000 mg | ORAL_CAPSULE | Freq: Two times a day (BID) | ORAL | Status: DC
  Administered 2013-02-06 – 2013-02-09 (×5): 100 mg via ORAL
  Filled 2013-02-06 (×8): qty 1

## 2013-02-06 MED ORDER — IOHEXOL 300 MG/ML  SOLN
INTRAMUSCULAR | Status: DC | PRN
  Administered 2013-02-06: 20 mL

## 2013-02-06 MED ORDER — SUCCINYLCHOLINE CHLORIDE 20 MG/ML IJ SOLN
INTRAMUSCULAR | Status: DC | PRN
  Administered 2013-02-06: 100 mg via INTRAVENOUS

## 2013-02-06 MED ORDER — FENTANYL CITRATE 0.05 MG/ML IJ SOLN
INTRAMUSCULAR | Status: DC | PRN
  Administered 2013-02-06 (×4): 50 ug via INTRAVENOUS

## 2013-02-06 MED ORDER — HYDROMORPHONE HCL PF 1 MG/ML IJ SOLN
INTRAMUSCULAR | Status: AC
  Filled 2013-02-06: qty 1

## 2013-02-06 MED ORDER — LIDOCAINE HCL (CARDIAC) 20 MG/ML IV SOLN
INTRAVENOUS | Status: DC | PRN
  Administered 2013-02-06: 100 mg via INTRAVENOUS

## 2013-02-06 MED ORDER — ONDANSETRON HCL 4 MG/2ML IJ SOLN: 4.0000 mg | INTRAMUSCULAR | Status: DC | PRN

## 2013-02-06 MED ORDER — LACTATED RINGERS IV SOLN
INTRAVENOUS | Status: DC | PRN
  Administered 2013-02-06: 13:00:00 via INTRAVENOUS

## 2013-02-06 MED ORDER — ROCURONIUM BROMIDE 100 MG/10ML IV SOLN
INTRAVENOUS | Status: AC
  Filled 2013-02-06: qty 1

## 2013-02-06 SURGICAL SUPPLY — 24 items
BAG URINE DRAINAGE (UROLOGICAL SUPPLIES) ×4 IMPLANT
BAG URO CATCHER STRL LF (DRAPE) ×4 IMPLANT
CATH FOLEY 3WAY 30CC 22FR (CATHETERS) ×2 IMPLANT
CATH HEMA 3WAY 30CC 24FR COUDE (CATHETERS) ×2 IMPLANT
CYSTOGRAFIN 30% 250ML (MISCELLANEOUS) ×2 IMPLANT
DRAPE CAMERA CLOSED 9X96 (DRAPES) ×4 IMPLANT
ELECT BUTTON HF 24-28F 2 30DE (ELECTRODE) ×2 IMPLANT
ELECT LOOP MED HF 24F 12D (CUTTING LOOP) ×2 IMPLANT
ELECT LOOP MED HF 24F 12D CBL (CLIP) ×4 IMPLANT
ELECT RESECT VAPORIZE 12D CBL (ELECTRODE) ×2 IMPLANT
GLOVE BIOGEL M STRL SZ7.5 (GLOVE) ×4 IMPLANT
GOWN STRL REUS W/TWL XL LVL3 (GOWN DISPOSABLE) ×4 IMPLANT
HOLDER FOLEY CATH W/STRAP (MISCELLANEOUS) IMPLANT
IV NS IRRIG 3000ML ARTHROMATIC (IV SOLUTION) ×4 IMPLANT
KIT ASPIRATION TUBING (SET/KITS/TRAYS/PACK) IMPLANT
MANIFOLD NEPTUNE II (INSTRUMENTS) ×4 IMPLANT
PACK CYSTO (CUSTOM PROCEDURE TRAY) ×4 IMPLANT
PLUG CATH AND CAP STER (CATHETERS) ×4 IMPLANT
STENT CONTOUR 6FRX26X.038 (STENTS) ×2 IMPLANT
SUT ETHILON 3 0 PS 1 (SUTURE) IMPLANT
SYR 30ML LL (SYRINGE) ×2 IMPLANT
SYRINGE IRR TOOMEY STRL 70CC (SYRINGE) ×6 IMPLANT
TUBING CONNECTING 10 (TUBING) ×3 IMPLANT
TUBING CONNECTING 10' (TUBING) ×1

## 2013-02-06 NOTE — Anesthesia Postprocedure Evaluation (Signed)
  Anesthesia Post-op Note  Patient: Gregory Santana  Procedure(s) Performed: Procedure(s) (LRB): TRANSURETHRAL RESECTION OF BLADDER TUMOR WITH GYRUS (TURBT-GYRUS) (N/A) CYSTOSCOPY WITH RIGHT RETROGRADE PYELOGRAM/RIGHT URETERAL STENT PLACEMENT (Right) CYSTOGRAM (N/A)  Patient Location: PACU  Anesthesia Type: General  Level of Consciousness: awake and alert   Airway and Oxygen Therapy: Patient Spontanous Breathing  Post-op Pain: mild  Post-op Assessment: Post-op Vital signs reviewed, Patient's Cardiovascular Status Stable, Respiratory Function Stable, Patent Airway and No signs of Nausea or vomiting  Last Vitals:  Filed Vitals:   02/06/13 1540  BP: 106/78  Pulse: 99  Temp: 36.6 C  Resp: 16    Post-op Vital Signs: stable   Complications: No apparent anesthesia complications. BP stable after one unit pRBC. Plan to start second unit pRBC and transfer to step down unit per plan.

## 2013-02-06 NOTE — Progress Notes (Signed)
Pt received from CRNA on NEO GTT,  BP80/49, HR 102. Ordered to transfue PRBC per CRNA LAURA.  Dr Tresa Moore aware.

## 2013-02-06 NOTE — H&P (Signed)
Gregory Santana is an 73 y.o. male.    Chief Complaint: PRe-Op Transurethral Resection Bladder Tumor, Bilateral Ureteral Stent Placement  HPI:   1 - Large Volume Bladder Cancer, Malignant Hydronephrosis - Large volume papillary tumor by office cyso 12/2012 on evaluation refracotyr voiding symptoms by Dr. Matilde Sprang. No tissue confirmation. CT 01/2013 with massive voluem bladder cancer and bilateral hydro to level of likely tumor. Large bilateral ext. iliac nodes on CT. Prior 50PY smoker, still smokes.  2 - Lower Urinary Tract Symptoms - Long and progressive history of obstructive and irritative symptoms includeing weak stream and urge incontincne. PVR 390 12/2012 and Qm 102m/sec. No sig help with alpha blockers.   PMH sig for CVA (no deficits), Psorriasis (no immune therapy), HTN. No CAD / MI. No limiting dyspnea.  Today WRanais seen to proceed with initial TURBT for tissue confirmation, staging, and hopeful unroofing of bilateral distal ureters / stent placement for his aparant large volume bladder cancer.   Past Medical History  Diagnosis Date  . Hypertension   . Hyperlipidemia   . Stroke 2002    no deficits  . Cancer     bladder  . Headache(784.0)     migraines  . Psoriasis 1/15    abdomen, sides of chest    Past Surgical History  Procedure Laterality Date  . Hernia repair Left     inguinal    Family History  Problem Relation Age of Onset  . Hypertension Mother    Social History:  reports that he has been smoking Cigarettes.  He has a 75 pack-year smoking history. He has never used smokeless tobacco. He reports that he does not drink alcohol or use illicit drugs.  Allergies: No Known Allergies  No prescriptions prior to admission    Results for orders placed during the hospital encounter of 02/05/13 (from the past 48 hour(s))  BASIC METABOLIC PANEL     Status: Abnormal   Collection Time    02/05/13  8:45 AM      Result Value Range   Sodium 138  137 - 147 mEq/L    Potassium 4.9  3.7 - 5.3 mEq/L   Chloride 102  96 - 112 mEq/L   CO2 26  19 - 32 mEq/L   Glucose, Bld 119 (*) 70 - 99 mg/dL   BUN 25 (*) 6 - 23 mg/dL   Creatinine, Ser 1.93 (*) 0.50 - 1.35 mg/dL   Calcium 9.8  8.4 - 10.5 mg/dL   GFR calc non Af Amer 34 (*) >90 mL/min   GFR calc Af Amer 39 (*) >90 mL/min   Comment: (NOTE)     The eGFR has been calculated using the CKD EPI equation.     This calculation has not been validated in all clinical situations.     eGFR's persistently <90 mL/min signify possible Chronic Kidney     Disease.  CBC     Status: Abnormal   Collection Time    02/05/13  8:45 AM      Result Value Range   WBC 13.4 (*) 4.0 - 10.5 K/uL   RBC 4.29  4.22 - 5.81 MIL/uL   Hemoglobin 13.7  13.0 - 17.0 g/dL   HCT 40.1  39.0 - 52.0 %   MCV 93.5  78.0 - 100.0 fL   MCH 31.9  26.0 - 34.0 pg   MCHC 34.2  30.0 - 36.0 g/dL   RDW 12.6  11.5 - 15.5 %   Platelets 350  150 - 400 K/uL   Dg Chest 2 View  02/05/2013   CLINICAL DATA:  Preop, bladder tumor  EXAM: CHEST  2 VIEW  COMPARISON:  None.  FINDINGS: Cardiomediastinal silhouette is unremarkable. Mild hyperinflation. No acute infiltrate or pulmonary edema. Bony thorax is unremarkable.  IMPRESSION: No active disease.  Mild hyperinflation.   Electronically Signed   By: Lahoma Crocker M.D.   On: 02/05/2013 09:15    Review of Systems  Constitutional: Negative.  Negative for fever and chills.  HENT: Negative.   Eyes: Negative.   Respiratory: Negative.   Cardiovascular: Negative.   Gastrointestinal: Negative.   Genitourinary: Positive for hematuria.  Musculoskeletal: Negative.   Skin: Negative.   Neurological: Negative.   Endo/Heme/Allergies: Negative.   Psychiatric/Behavioral: Negative.     There were no vitals taken for this visit. Physical Exam  Constitutional: He is oriented to person, place, and time. He appears well-developed and well-nourished.  Appears older than stated age  HENT:  Head: Normocephalic and atraumatic.   Eyes: EOM are normal. Pupils are equal, round, and reactive to light.  Neck: Normal range of motion. Neck supple.  Cardiovascular: Normal rate.   Respiratory: Effort normal.  GI: Soft. Bowel sounds are normal.  Genitourinary: Penis normal.  Musculoskeletal: Normal range of motion.  Neurological: He is alert and oriented to person, place, and time.  Skin: Skin is warm and dry.  Psychiatric: He has a normal mood and affect. His behavior is normal. Judgment and thought content normal.     Assessment/Plan  1 - Large Volume Bladder Cancer, Malignant Hydronephrosis - Huge volume tumor, ceratinly concern for T3 or more disease, ? pelvic adenopathy.  Proceed with TURBT today for initial staging, tissue diagnosis, and initial therapy, would likely keep overnight and need endotracheal anesthesia as well as bilat stents if possible.  We rediscussed operative biopsy / transurethral resection as the best next step for diagnostic and therapeutic purposes with goals being to remove all visible cancer and obtain tissue for pathologic exam. We rediscussed that for some low-grade tumors, this may be all the treatment required, but that for many other tumors such as high-grade lesions, further therapy including surgery and or chemotherapy may be warranted. We also reoutlined the fact that any bladder cancer diagnosis will require close follow-up with periodic upper and lower tract evaluation. We rediscussed risks including bleeding, infection, damage to kidney / ureter / bladder including bladder perforation which can typically managed with prolonged foley catheterization. We rementioned anesthetic and other rare risks including DVT, PE, MI, and mortality. I also rementioned that adjunctive procedures such as ureteral stenting, retrograde pyelography, and ureteroscopy may be necessary to fully evaluate the urinary tract depending on intra-operative findings. After answering all questions to the patient's  satisfaction, they wish to proceed, will plan for overnight observation as likely large tumor.  2 - Lower Urinary Tract Symptoms - Likely combination irritation from large bladder tumor and obstruction from prostatic hypertrophy. TURBT as per above.  Gregory Santana, Gregory Santana 02/06/2013, 6:44 AM

## 2013-02-06 NOTE — Preoperative (Signed)
Beta Blockers   Reason not to administer Beta Blockers:Not Applicable 

## 2013-02-06 NOTE — Progress Notes (Signed)
Dr Tresa Moore in unit. Ordered hold on PRBC until H and H results.  Dr Deirdre Priest aware.

## 2013-02-06 NOTE — Transfer of Care (Signed)
Immediate Anesthesia Transfer of Care Note  Patient: Gregory Santana  Procedure(s) Performed: Procedure(s) (LRB): TRANSURETHRAL RESECTION OF BLADDER TUMOR WITH GYRUS (TURBT-GYRUS) (N/A) CYSTOSCOPY WITH RIGHT RETROGRADE PYELOGRAM/RIGHT URETERAL STENT PLACEMENT (Right) CYSTOGRAM (N/A)  Patient Location: PACU  Anesthesia Type: General  Level of Consciousness: sedated, patient cooperative and responds to stimulation  Airway & Oxygen Therapy: Patient Spontanous Breathing and Patient connected to face mask oxgen  Post-op Assessment: Report given to PACU RN and Post -op Vital signs reviewed and stable  Post vital signs: Reviewed and stable  Complications: No apparent anesthesia complications

## 2013-02-06 NOTE — Brief Op Note (Signed)
02/06/2013  1:29 PM  PATIENT:  Gregory Santana  73 y.o. male  PRE-OPERATIVE DIAGNOSIS:  MASSIVE BLADDER CANCER  POST-OPERATIVE DIAGNOSIS:  MASSIVE BLADDER CANCER  PROCEDURE:  Procedure(s): TRANSURETHRAL RESECTION OF BLADDER TUMOR WITH GYRUS (TURBT-GYRUS) (N/A) CYSTOSCOPY WITH RIGHT RETROGRADE PYELOGRAM/RIGHT URETERAL STENT PLACEMENT (Right) CYSTOGRAM (N/A)  SURGEON:  Surgeon(s) and Role:    * Alexis Frock, MD - Primary  PHYSICIAN ASSISTANT:   ASSISTANTS: none   ANESTHESIA:   general  EBL:  Total I/O In: 1000 [I.V.:1000] Out: 400 [Blood:400]  BLOOD ADMINISTERED:none  DRAINS: 24 F 3 way foley to NS irrigation   LOCAL MEDICATIONS USED:  NONE  SPECIMEN:  Source of Specimen:  Bladder TUmor  DISPOSITION OF SPECIMEN:  PATHOLOGY  COUNTS:  YES  TOURNIQUET:  * No tourniquets in log *  DICTATION: .Other Dictation: Dictation Number [id number]  PLAN OF CARE: Admit to inpatient   PATIENT DISPOSITION:  PACU - hemodynamically stable.   Delay start of Pharmacological VTE agent (>24hrs) due to surgical blood loss or risk of bleeding: yes
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
## 2013-02-07 LAB — BASIC METABOLIC PANEL
BUN: 26 mg/dL — AB (ref 6–23)
CO2: 23 mEq/L (ref 19–32)
CREATININE: 1.89 mg/dL — AB (ref 0.50–1.35)
Calcium: 8.3 mg/dL — ABNORMAL LOW (ref 8.4–10.5)
Chloride: 103 mEq/L (ref 96–112)
GFR calc Af Amer: 40 mL/min — ABNORMAL LOW (ref >90)
GFR, EST NON AFRICAN AMERICAN: 34 mL/min — AB (ref >90)
Glucose, Bld: 150 mg/dL — ABNORMAL HIGH (ref 70–99)
Potassium: 5.1 mEq/L (ref 3.7–5.3)
Sodium: 136 mEq/L — ABNORMAL LOW (ref 137–147)

## 2013-02-07 LAB — TYPE AND SCREEN
ABO/RH(D): B POS
Antibody Screen: NEGATIVE
UNIT DIVISION: 0
Unit division: 0

## 2013-02-07 LAB — CBC
HEMATOCRIT: 29.3 % — AB (ref 39.0–52.0)
Hemoglobin: 10.3 g/dL — ABNORMAL LOW (ref 13.0–17.0)
MCH: 31.7 pg (ref 26.0–34.0)
MCHC: 35.2 g/dL (ref 30.0–36.0)
MCV: 90.2 fL (ref 78.0–100.0)
Platelets: 243 10*3/uL (ref 150–400)
RBC: 3.25 MIL/uL — ABNORMAL LOW (ref 4.22–5.81)
RDW: 13.6 % (ref 11.5–15.5)
WBC: 17.4 10*3/uL — ABNORMAL HIGH (ref 4.0–10.5)

## 2013-02-07 MED ORDER — SODIUM CHLORIDE 0.9 % IV BOLUS (SEPSIS)
1000.0000 mL | Freq: Once | INTRAVENOUS | Status: AC
  Administered 2013-02-07: 1000 mL via INTRAVENOUS

## 2013-02-07 NOTE — Op Note (Signed)
NAME:  Gregory Santana, Gregory Santana NO.:  [id number]  MEDICAL RECORD NO.:  [id number]  LOCATION:  0865                         FACILITY:  [facility]  PHYSICIAN:  Alexis Frock, MD     DATE OF BIRTH:  01-29-1941  DATE OF PROCEDURE: 02/06/2013 DATE OF DISCHARGE:                              OPERATIVE REPORT   DIAGNOSES: 1. Large volume bladder cancer. 2. Bilateral hydronephrosis, malignant.  PROCEDURE: 1. Transurethral resection of bladder tumor, volume large. TOtal Tumor area >20cm2 2. Right retrograde pyelogram interpretation. 3. Placement of right ureteral stent, 6 x 26, no tether. 4. Cystogram with interpretation.  ESTIMATED BLOOD LOSS:  350 mL.  COMPLICATIONS:  None.  SPECIMEN:  Bladder tumor for permanent Pathology.  FINDINGS: 1. Massive amounts of very friable papillary bladder tumor in all     quadrants of the bladder occupying approximately 85% of total     circumference. 2. Visualization of right ureteral orifice with moderate     hydroureteronephrosis. 3. Nonvisualization of left ureteral orifice despite resecting down     directly on to the presumed area of left trigone. 4. No evidence of bladder perforation on final cystogram.  INDICATIONS:  Gregory Santana is a pleasant 73 year old gentleman with very long smoking history and recent history of refractory irritative voiding symptoms.  He underwent evaluation for this including cystoscopy by my colleague, Dr. Bjorn Loser, and he was found to have a large amount of bladder tumor.  I saw him in consultation for this, reviewed his x- rays, which corroborated large volume clinically localized bladder cancer.  No obvious metastasis, bilateral malignant obstruction.  The patient was immediately counseled towards transurethral resection for diagnostic staging and treating purposes and he wished to proceed. Informed consent was obtained and placed in medical record.  PROCEDURE IN DETAIL:  The patient being [name] verified and procedure being transurethral resection of bladder tumor, bilateral pyelograms plus bilateral stenting was confirmed.  Procedure was carried out.  Time-out was performed.  Intravenous antibiotics were administered.  General endotracheal anesthesia was introduced.  The patient was placed into a low lithotomy position.  A sterile field created by prepping and draping the patient's penis, perineum, and proximal thighs using iodine x3.  The patient even during prepping had efflux of copious bloody urine per urethra corroborating ongoing bleeding from his large volume tumor.  Next, cystourethroscopy was performed using a 26-French continuous flow resectoscope sheath with visual obturator.  Inspection of the anterior and posterior urethra revealed massive amount of papillary tumor occupying, it was felt to be somewhat of the prostatic lobes.  Inspection of the urinary bladder revealed more tumor and formed clot.  Clot was irrigated out, however, this was unable to be completely cleared as there was friable bleeding tissue in all quadrants of the bladder estimated 80% circumference.  As the goal today was to try to obtain hemostasis, obtain diagnosis hopefully place ureteral stents, resectoscope loop, size medium was placed using normal saline irrigation.  Systematic resection was performed of the area of the umbilicus neck in anterior-posterior direction, which made a more open channel for flow.  The right ureteral orifice was visualized during these maneuvers using the cystoscope.  The  right ureteral orifice was cannulated with a 6-French end-hole catheter and right retrograde pyelogram was obtained.  Right retrograde pyelogram demonstrated a very tortuous right ureter with hydroureteronephrosis consistent with malignant obstruction.  A 0.03 glidewire was advanced over which a new 6 x 26 stent was placed. Good proximal and distal curl were noted.  Resection was then  performed in the presumed area of the left ureteral orifice in top-down fashion through bulky tumor.  This was performed to what was felt to be probably flushed with the bladder muscular layers.  Ureteral orifice was not encountered.  It was felt that resection deeper than this would be hazardous and was not performed.  Additional coagulation current was applied to friable bleeding tumor, which resulted in acceptable hemostasis, but by no means complete.  It was felt that complete hemostasis would not possible with this modality.  Given the poor visualization, it was felt that cystogram was warranted.  As such, the cystoscope sheath was exchanged for a 24-French continuous flow through a catheter with 30 mL of sterile water in the balloon and cystogram was obtained by instilling approximately 260 mL of Cysto-Conray into the urinary bladder.  This revealed no obvious gross extravasation of contrast and no evidence of bladder perforation.  Bladder was emptied, irrigated with saline, and there was complete resolution of all contrast material also again verifying no evidence of perforation.  The catheter was then placed on gentle traction using dual leg straps, connected to normal saline irrigation, urine dark pink and without clots.  The procedure was terminated.  The patient tolerated the procedure well. There were no immediate periprocedural complications.  The patient was taken to the postanesthesia care unit in stable condition.  Decision was made to give 2 units of blood in the postanesthesia care unit given his blood volume loss and noting that he was anemic somewhat beforehand given his ongoing bleeding.          ______________________________ Alexis Frock, MD     TM/MEDQ  D:  02/06/2013  T:  02/07/2013  Job:  [id number]

## 2013-02-07 NOTE — Progress Notes (Signed)
Urology Progress Note  1 Day Post-Op : 1. No blue urine seen overnight 2. Respiratory: Room air 02 sat 96% this AM.  3. Hypotention: BP down to 70-80 overnight , Rx 1L IV fluid. Now 95/50 4. Gross hematuria post TUR-BT: Large BT resected. +++ clots irrigated with CBI and by hand. Urine red this AM. CBI still running.   Subjective:     No acute urologic events overnight. Ambulation:   negative. Sitting in chair.  Flatus:    positive Bowel movement  negative  Pain: some relief  Objective:  Blood pressure 125/60, pulse 85, temperature 97.5 F (36.4 C), temperature source Oral, resp. rate 19, height 6\' 1"  (1.854 m), weight 118.2 kg (260 lb 9.3 oz), SpO2 98.00%.  Physical Exam:  General:  No acute distress, awake Resp: clear to auscultation bilaterally Genitourinary:  Normql.  Foley: yes. Bloody urine.     I/O last 3 completed shifts: In: 34196 [P.O.:360; I.V.:5050; Blood:650; Other:7900; IV Piggyback:1000] Out: 2229 [Urine:8375; Blood:600]  Recent Labs     02/05/13  0845  02/06/13  1344  02/07/13  0112  HGB  13.7  10.0*  10.3*  WBC  13.4*   --   17.4*  PLT  350   --   243    Recent Labs     02/06/13  1344  02/07/13  0112  NA  139  136*  K  4.7  5.1  CL  104  103  CO2  22  23  BUN  26*  26*  CREATININE  2.05*  1.89*  CALCIUM  8.1*  8.3*  GFRNONAA  31*  34*  GFRAA  36*  40*     No results found for this basename: PT, INR, APTT,  in the last 72 hours   No components found with this basename: ABG,   Assessment/Plan:  Catheter not removed. Continue any current medications. Follow up in AM.  Leave on Stepdown status. Possible transfer to floor in AM.  H/H in AM

## 2013-02-08 LAB — HEMOGLOBIN AND HEMATOCRIT, BLOOD
HEMATOCRIT: 28.2 % — AB (ref 39.0–52.0)
HEMOGLOBIN: 9.8 g/dL — AB (ref 13.0–17.0)

## 2013-02-08 NOTE — Progress Notes (Signed)
Urology Progress Note  2 Days Post-Op post TUR-BT, large bladder tumor. Pt requiring intermittant CBI, wit occasional hand irrigation of clot. Urine clear this AM. When he develops clot, he becomes very anxious, and then develops breathing difficulty. Otherwise, breathing easily on Room Air.   Subjective:     No acute urologic events overnight. Ambulation:   negative. Up i chair. Daughter stays with father.  Flatus:    positive Bowel movement  negative  Pain: some relief  Objective:  Blood pressure 125/61, pulse 73, temperature 98.2 F (36.8 C), temperature source Oral, resp. rate 16, height 6\' 1"  (1.854 m), weight 118.2 kg (260 lb 9.3 oz), SpO2 95.00%.  Physical Exam:  General:  No acute distress, awake Resp: clear to auscultation bilaterally Genitourinary:  wnl. Foley:  Yes.     I/O last 3 completed shifts: In: 23200 [P.O.:600; I.V.:3600; Other:18000; IV Piggyback:1000] Out: 23775 [Urine:23775]  Recent Labs     02/05/13  0845   02/07/13  0112  02/08/13  0410  HGB  13.7   < >  10.3*  9.8*  WBC  13.4*   --   17.4*   --   PLT  350   --   243   --    < > = values in this interval not displayed.    Recent Labs     02/06/13  1344  02/07/13  0112  NA  139  136*  K  4.7  5.1  CL  104  103  CO2  22  23  BUN  26*  26*  CREATININE  2.05*  1.89*  CALCIUM  8.1*  8.3*  GFRNONAA  31*  34*  GFRAA  36*  40*     No results found for this basename: PT, INR, APTT,  in the last 72 hours   No components found with this basename: ABG,   Assessment/Plan: Improvement in renal function overnight. Clearing of urine, but has intermittent clots.  Catheter not removed. Send to floor with intermittent CBI. Leave foley in AM until Dr. Tresa Moore rounds. ? D/c foley in AM.

## 2013-02-09 ENCOUNTER — Encounter (HOSPITAL_COMMUNITY): Payer: Self-pay | Admitting: Urology

## 2013-02-09 LAB — BASIC METABOLIC PANEL
BUN: 15 mg/dL (ref 6–23)
CO2: 25 mEq/L (ref 19–32)
Calcium: 8.7 mg/dL (ref 8.4–10.5)
Chloride: 102 mEq/L (ref 96–112)
Creatinine, Ser: 1.67 mg/dL — ABNORMAL HIGH (ref 0.50–1.35)
GFR calc Af Amer: 46 mL/min — ABNORMAL LOW (ref >90)
GFR, EST NON AFRICAN AMERICAN: 40 mL/min — AB (ref >90)
Glucose, Bld: 102 mg/dL — ABNORMAL HIGH (ref 70–99)
POTASSIUM: 4.7 meq/L (ref 3.7–5.3)
Sodium: 137 mEq/L (ref 137–147)

## 2013-02-09 LAB — HEMOGLOBIN AND HEMATOCRIT, BLOOD
HCT: 28.3 % — ABNORMAL LOW (ref 39.0–52.0)
Hemoglobin: 9.4 g/dL — ABNORMAL LOW (ref 13.0–17.0)

## 2013-02-09 MED ORDER — SENNOSIDES-DOCUSATE SODIUM 8.6-50 MG PO TABS: 1.0000 | ORAL_TABLET | Freq: Two times a day (BID) | ORAL | Status: DC

## 2013-02-09 MED ORDER — TRAMADOL HCL 50 MG PO TABS: 50.0000 mg | ORAL_TABLET | Freq: Four times a day (QID) | ORAL | Status: DC | PRN

## 2013-02-09 NOTE — Discharge Summary (Signed)
Physician Discharge Summary  Patient ID: Gregory Santana MRN: [id number] DOB/AGE: 1940/10/15 73 y.o.  Admit date: 02/06/2013 Discharge date: 02/09/2013  Admission Diagnoses: Large Volume Bladder Cancer, Hematuria  Discharge Diagnoses:  Large Volume Bladder Cancer, Hematuria Active Problems:   Bladder cancer   Discharged Condition: good  Hospital Course:   1 - Large Volume Bladder Cancer, Malignant Hydronephrosis - s/p transurethral resection of part of massive papillary bladder cancer and right ureteral stent placement 02/06/13 found on evaluation for gross hematuria. Given 2upRBC and observed in stepdown due to his assumed pre-op anemia and continued blood loss peri-opearatvely. By 1/12, the day of discharge, pt's Hgb stable, gross hematuria resolving, and pt able to void x several w/o catheter and felt adeuqate for discharge. His Cr also trending down at DC form orrignaly 2's to 1.6.    Consults: None  Significant Diagnostic Studies: labs: Hgb 9.4, Cr 1.67 at discharge. Path pending.  Treatments: surgery:  transurethral resection of part of massive papillary bladder cancer and right ureteral stent placement 02/06/13  Discharge Exam: Blood pressure 139/72, pulse 90, temperature 97.6 F (36.4 C), temperature source Oral, resp. rate 18, height 6\' 1"  (1.854 m), weight 118.2 kg (260 lb 9.3 oz), SpO2 98.00%. General appearance: alert, cooperative, appears stated age and son at bedside Head: Normocephalic, without obvious abnormality, atraumatic Eyes: conjunctivae/corneas clear. PERRL, EOM's intact. Fundi benign. Ears: normal TM's and external ear canals both ears Nose: Nares normal. Septum midline. Mucosa normal. No drainage or sinus tenderness. Throat: lips, mucosa, and tongue normal; teeth and gums normal Neck: no adenopathy, no carotid bruit, no JVD, supple, symmetrical, trachea midline and thyroid not enlarged, symmetric, no tenderness/mass/nodules Back: symmetric, no curvature. ROM  normal. No CVA tenderness. Resp: clear to auscultation bilaterally Chest wall: no tenderness Cardio: regular rate and rhythm, S1, S2 normal, no murmur, click, rub or gallop GI: soft, non-tender; bowel sounds normal; no masses,  no organomegaly Male genitalia: normal, foreskin reduced Extremities: extremities normal, atraumatic, no cyanosis or edema Pulses: 2+ and symmetric Skin: Skin color, texture, turgor normal. No rashes or lesions Lymph nodes: Cervical, supraclavicular, and axillary nodes normal. Neurologic: Grossly normal  Disposition:      Medication List    ASK your doctor about these medications       aspirin 325 MG tablet  Take 325 mg by mouth daily.     atorvastatin 20 MG tablet  Commonly known as:  LIPITOR  Take 20 mg by mouth daily with supper.     doxazosin 4 MG tablet  Commonly known as:  CARDURA  Take 4 mg by mouth daily with lunch.     ibuprofen 600 MG tablet  Commonly known as:  ADVIL,MOTRIN  Take 600 mg by mouth every 6 (six) hours as needed for mild pain or moderate pain.     lisinopril 5 MG tablet  Commonly known as:  PRINIVIL,ZESTRIL  Take 5 mg by mouth daily with supper.     LORazepam 0.5 MG tablet  Commonly known as:  ATIVAN  Take 0.5 mg by mouth at bedtime as needed for sleep.     mometasone 0.1 % cream  Commonly known as:  ELOCON  Apply 1 application topically daily as needed (dry skin). Apply to affected area daily as needed           Follow-up Information   Follow up with Alexis Frock, MD. (02/19/13 at 9:45 AM)    Specialty:  Urology   Contact information:   [address]  [facility]  [place] [address] [phone]       Signed: Alexis Frock 02/09/2013, 11:43 AM

## 2013-02-09 NOTE — Discharge Instructions (Signed)
1 - You may have urinary urgency (bladder spasms) and bloody urine on / off with stent in place. This is normal. ° °2 - Call MD or go to ER for fever >102, severe pain / nausea / vomiting not relieved by medications, or acute change in medical status ° °

## 2013-02-19 ENCOUNTER — Telehealth: Payer: Self-pay | Admitting: Family Medicine

## 2013-02-19 NOTE — Telephone Encounter (Signed)
The urologist office that is going to do patients surgery scheduled patient for an appointment for surgery clearance on Monday. Patients daughter wants to know if this is necessary to fill the clearance form out since he was just seen a month ago.

## 2013-02-20 NOTE — Telephone Encounter (Signed)
Yes it is wise to keep that appointment. Surgical clearance evaluation is different. We discussed the different items. We also included different things and our report. I believe it is in his best interest to keep that.

## 2013-02-20 NOTE — Telephone Encounter (Signed)
Spoke with daughter. She is going to get the surgical clearance form and call us back to make an appt for the form to be filled out.

## 2013-02-23 ENCOUNTER — Ambulatory Visit: Payer: Medicare PPO | Admitting: Family Medicine

## 2013-02-23 ENCOUNTER — Other Ambulatory Visit: Payer: Self-pay | Admitting: Urology

## 2013-03-05 ENCOUNTER — Encounter: Payer: Self-pay | Admitting: Family Medicine

## 2013-03-05 ENCOUNTER — Ambulatory Visit (INDEPENDENT_AMBULATORY_CARE_PROVIDER_SITE_OTHER): Payer: Medicare PPO | Admitting: Family Medicine

## 2013-03-05 VITALS — BP 146/82 | Ht 73.0 in | Wt 255.0 lb

## 2013-03-05 DIAGNOSIS — E785 Hyperlipidemia, unspecified: Secondary | ICD-10-CM

## 2013-03-05 DIAGNOSIS — C679 Malignant neoplasm of bladder, unspecified: Secondary | ICD-10-CM

## 2013-03-05 DIAGNOSIS — I10 Essential (primary) hypertension: Secondary | ICD-10-CM

## 2013-03-05 MED ORDER — TRAMADOL HCL 50 MG PO TABS: 50.0000 mg | ORAL_TABLET | Freq: Four times a day (QID) | ORAL | Status: DC | PRN

## 2013-03-05 NOTE — Progress Notes (Addendum)
   Subjective:    Patient ID: Gregory Santana, male    DOB: [birth year], 73 y.o.   MRN: [id number]  HPINeeds form filled out for surgical clearance.   Having Migraines about every other day. This is worse since stopping blood pressure meds for surgery. He describes it more as aching in the head comes and goes no nausea or vomiting. He denies chest tightness with walking he does not do much exercise denies rectal bleeding denies vomiting diarrhea. He is a little scared about surgery coming up but he is prepared. His daughter is here today in support of him.  Review of Systems  Constitutional: Negative for fever, activity change and appetite change.  HENT: Negative for congestion and rhinorrhea.   Eyes: Negative for discharge.  Respiratory: Negative for cough and wheezing.   Cardiovascular: Negative for chest pain.  Gastrointestinal: Negative for vomiting, abdominal pain and blood in stool.  Genitourinary: Positive for difficulty urinating. Negative for frequency.  Musculoskeletal: Negative for neck pain.  Skin: Negative for rash.  Allergic/Immunologic: Negative for environmental allergies and food allergies.  Neurological: Positive for headaches. Negative for weakness.  Psychiatric/Behavioral: Negative for agitation.       Objective:   Physical Exam  Constitutional: He appears well-developed and well-nourished.  HENT:  Head: Normocephalic and atraumatic.  Nose: Nose normal.  Mouth/Throat: Oropharynx is clear and moist.  Neck: Neck supple. No thyromegaly present.  Carotid bruit noted on both sides.  Cardiovascular: Normal rate, regular rhythm and normal heart sounds.   No murmur heard. Pulmonary/Chest: Effort normal and breath sounds normal. No respiratory distress. He has no wheezes.  Abdominal: Soft. Bowel sounds are normal. He exhibits no distension and no mass. There is no tenderness.  Musculoskeletal: Normal range of motion. He exhibits no edema.  Lymphadenopathy:    He has  no cervical adenopathy.  Neurological: He is alert. He exhibits normal muscle tone.  Skin: Skin is warm and dry. No erythema.  Psychiatric: He has a normal mood and affect. His behavior is normal. Judgment normal.          Assessment & Plan:  #1 HTN-blood pressure actually doing very well currently not on any medications. He is encouraged to have this watch  #2 COPD-am sure this patient has some element of COPD. For years he has never been want to do much testing. He has a long history of smoking from age 66 all the way to present. He's been encouraged greatly to quit smoking.  #3 history of stroke-he will be due a carotid ultrasound study in June. Patient was encouraged to continue his cholesterol medicine. He is also encouraged to quit smoking.  #4 bladder cancer-this patient is facing a rather extensive surgery. I believe he can do this surgery. Special attention should be given to preventing blood clots. Also special attention to monitoring his blood pressure. Finally this patient may end up needing some respiratory treatments for reactive airway associated with smoking history. He has not had any recently but surgery may precipitate it. Patient was encouraged even if he can't quit smoking long-term to at least quit for 7 days prior to the surgery.  I discussed the case with cardiology and they will be doing pharm stress test as part of his pre-op , he has moderate risk and the surgery will be involved  We will see the patient back several weeks after surgery.

## 2013-03-05 NOTE — Patient Instructions (Signed)
Use Nicotine patch 7 days before the surgery and stop smoking at least 7 days before surgery.  No anti-inflammatories at least 7 days before surgery

## 2013-03-06 ENCOUNTER — Other Ambulatory Visit: Payer: Self-pay | Admitting: Family Medicine

## 2013-03-06 DIAGNOSIS — R0600 Dyspnea, unspecified: Secondary | ICD-10-CM

## 2013-03-07 ENCOUNTER — Other Ambulatory Visit: Payer: Self-pay | Admitting: Family Medicine

## 2013-03-10 ENCOUNTER — Encounter: Payer: Self-pay | Admitting: Cardiovascular Disease

## 2013-03-10 ENCOUNTER — Encounter: Payer: Self-pay | Admitting: *Deleted

## 2013-03-10 ENCOUNTER — Ambulatory Visit (INDEPENDENT_AMBULATORY_CARE_PROVIDER_SITE_OTHER): Payer: Medicare PPO | Admitting: Cardiovascular Disease

## 2013-03-10 ENCOUNTER — Telehealth: Payer: Self-pay | Admitting: Family Medicine

## 2013-03-10 VITALS — BP 124/70 | HR 97 | Ht 73.0 in | Wt 252.0 lb

## 2013-03-10 DIAGNOSIS — I10 Essential (primary) hypertension: Secondary | ICD-10-CM

## 2013-03-10 DIAGNOSIS — N183 Chronic kidney disease, stage 3 unspecified: Secondary | ICD-10-CM

## 2013-03-10 DIAGNOSIS — I6529 Occlusion and stenosis of unspecified carotid artery: Secondary | ICD-10-CM

## 2013-03-10 DIAGNOSIS — F17200 Nicotine dependence, unspecified, uncomplicated: Secondary | ICD-10-CM

## 2013-03-10 DIAGNOSIS — Z720 Tobacco use: Secondary | ICD-10-CM

## 2013-03-10 DIAGNOSIS — R0989 Other specified symptoms and signs involving the circulatory and respiratory systems: Secondary | ICD-10-CM

## 2013-03-10 DIAGNOSIS — I658 Occlusion and stenosis of other precerebral arteries: Secondary | ICD-10-CM

## 2013-03-10 DIAGNOSIS — Z8673 Personal history of transient ischemic attack (TIA), and cerebral infarction without residual deficits: Secondary | ICD-10-CM

## 2013-03-10 DIAGNOSIS — E785 Hyperlipidemia, unspecified: Secondary | ICD-10-CM

## 2013-03-10 DIAGNOSIS — Z01810 Encounter for preprocedural cardiovascular examination: Secondary | ICD-10-CM

## 2013-03-10 DIAGNOSIS — R0609 Other forms of dyspnea: Secondary | ICD-10-CM

## 2013-03-10 DIAGNOSIS — I6523 Occlusion and stenosis of bilateral carotid arteries: Secondary | ICD-10-CM

## 2013-03-10 MED ORDER — METOPROLOL SUCCINATE ER 25 MG PO TB24: 25.0000 mg | ORAL_TABLET | Freq: Every day | ORAL | Status: DC

## 2013-03-10 MED ORDER — LISINOPRIL 5 MG PO TABS: ORAL_TABLET | ORAL | Status: DC

## 2013-03-10 NOTE — Patient Instructions (Signed)
Your physician has requested that you have an echocardiogram. Echocardiography is a painless test that uses sound waves to create images of your heart. It provides your doctor with information about the size and shape of your heart and how well your heart's chambers and valves are working. This procedure takes approximately one hour. There are no restrictions for this procedure. Your physician has requested that you have a lexiscan myoview. For further information please visit [email or web address]. Please follow instruction sheet, as given. Office will contact with results via phone or letter.   Begin Toprol XL 25mg  daily - new sent to pharm Continue all other medications.   Follow up based on test results

## 2013-03-10 NOTE — Addendum Note (Signed)
Addended by: Laurine Blazer on: 03/10/2013 09:47 AM   Modules accepted: Orders

## 2013-03-10 NOTE — Progress Notes (Signed)
Patient ID: Gregory Santana, male   DOB: 1940-11-07, 73 y.o.   MRN: [id number]       CARDIOLOGY CONSULT NOTE  Patient ID: Gregory Santana MRN: [id number] DOB/AGE: 1940/12/21 73 y.o.  Admit date: (Not on file) Primary Physician Sallee Lange, MD  Reason for Consultation: preoperative risk stratification  HPI: The patient is a 73 year old male with a history of bladder cancer who is to undergo extensive surgery for this. He also has a history of hypertension, CVA, CKD stage III, hyperlipidemia, probable COPD, and carotid artery stenosis. I spoke with Dr. Nicki Reaper looking with regards to this patient's preoperative risk, and he is thus referred today. An ECG performed in January 2013 showed normal sinus rhythm, isolated PVC, left anterior fascicular block, and possible old inferior infarct. The patient says she gets very little physical activity and mostly sits around all day. He denies exertional chest pain and palpitations. He occasionally gets lightheaded if he stands up too quickly but denies syncope. He does not have stairs at home and denies having climbed a flight of stairs in the past 4 weeks. He denies exertional dyspnea but does not walk quickly. He occasionally has bilateral feet swelling. He denies paroxysmal nocturnal dyspnea. He sleeps in a recliner and has done so for 20 years as he suffered a stroke approximately 18-20 years ago while lying in bed, and has been afraid to sleep lying in bed since that time.  Review of labs from 02/09/2013: Hgb 9.4, BUN 15, creatinine 1.67, GFR 40 mL per minute.    No Known Allergies  Current Outpatient Prescriptions  Medication Sig Dispense Refill  . atorvastatin (LIPITOR) 20 MG tablet TAKE ONE TABLET DAILY.  30 tablet  5  . ibuprofen (ADVIL,MOTRIN) 600 MG tablet       . lisinopril (PRINIVIL,ZESTRIL) 5 MG tablet       . LORazepam (ATIVAN) 0.5 MG tablet Take 0.5 mg by mouth at bedtime as needed for sleep.      . traMADol (ULTRAM) 50 MG tablet  Take 1 tablet (50 mg total) by mouth every 6 (six) hours as needed. For Cancer Pain  40 tablet  0   No current facility-administered medications for this visit.    Past Medical History  Diagnosis Date  . Hypertension   . Hyperlipidemia   . Stroke 2002    no deficits  . Cancer     bladder  . Headache(784.0)     migraines  . Psoriasis 1/15    abdomen, sides of chest    Past Surgical History  Procedure Laterality Date  . Hernia repair Left     inguinal  . Transurethral resection of bladder tumor with gyrus (turbt-gyrus) N/A 02/06/2013    Procedure: TRANSURETHRAL RESECTION OF BLADDER TUMOR WITH GYRUS (TURBT-GYRUS);  Surgeon: Alexis Frock, MD;  Location: [facility];  Service: Urology;  Laterality: N/A;  . Cystoscopy w/ ureteral stent placement Right 02/06/2013    Procedure: CYSTOSCOPY WITH RIGHT RETROGRADE PYELOGRAM/RIGHT URETERAL STENT PLACEMENT;  Surgeon: Alexis Frock, MD;  Location: [facility];  Service: Urology;  Laterality: Right;  . Cystogram N/A 02/06/2013    Procedure: CYSTOGRAM;  Surgeon: Alexis Frock, MD;  Location: [facility];  Service: Urology;  Laterality: N/A;    History   Social History  . Marital Status: Widowed    Spouse Name: N/A    Number of Children: N/A  . Years of Education: N/A   Occupational History  . Not on file.   Social History Main Topics  .  Smoking status: Current Every Day Smoker -- 1.50 packs/day for 50 years    Types: Cigarettes  . Smokeless tobacco: Never Used  . Alcohol Use: No  . Drug Use: No  . Sexual Activity: Not on file   Other Topics Concern  . Not on file   Social History Narrative  . No narrative on file     Family History  Problem Relation Age of Onset  . Hypertension Mother      Prior to Admission medications   Medication Sig Start Date End Date Taking? Authorizing Provider  atorvastatin (LIPITOR) 20 MG tablet TAKE ONE TABLET DAILY. 03/07/13   Kathyrn Drown, MD  ibuprofen (ADVIL,MOTRIN) 600 MG tablet  01/31/13   Historical  Provider, MD  lisinopril (PRINIVIL,ZESTRIL) 5 MG tablet  01/31/13   Historical Provider, MD  LORazepam (ATIVAN) 0.5 MG tablet Take 0.5 mg by mouth at bedtime as needed for sleep.    Historical Provider, MD  traMADol (ULTRAM) 50 MG tablet Take 1 tablet (50 mg total) by mouth every 6 (six) hours as needed. For Cancer Pain 03/05/13   Kathyrn Drown, MD     Review of systems complete and found to be negative unless listed above in HPI   BP 146/82  Pulse 103    Physical exam Height 6\' 1"  (1.854 m), weight 252 lb (114.306 kg). General: NAD Neck: No JVD, no thyromegaly or thyroid nodule.  Lungs: Clear to auscultation bilaterally with diminished respiratory effort. CV: Nondisplaced PMI.  Heart regular but distant, normal S1/S2, no S3/S4, no murmur. Trace peripheral edema.  No carotid bruit.  Normal pedal pulses.  Abdomen: Soft, nontender, no hepatosplenomegaly, no distention.  Skin: Intact without lesions or rashes.  Neurologic: Alert and oriented x 3.  Psych: Normal affect. Extremities: No clubbing or cyanosis.  HEENT: Normal.   Labs:   Lab Results  Component Value Date   WBC 17.4* 02/07/2013   HGB 9.4* 02/09/2013   HCT 28.3* 02/09/2013   MCV 90.2 02/07/2013   PLT 243 02/07/2013   No results found for this basename: NA, K, CL, CO2, BUN, CREATININE, CALCIUM, LABALBU, PROT, BILITOT, ALKPHOS, ALT, AST, GLUCOSE,  in the last 168 hours No results found for this basename: CKTOTAL, CKMB, CKMBINDEX, TROPONINI    Lab Results  Component Value Date   CHOL 113 07/08/2012   Lab Results  Component Value Date   HDL 23* 07/08/2012   Lab Results  Component Value Date   LDLCALC 41 07/08/2012   Lab Results  Component Value Date   TRIG 246* 07/08/2012   Lab Results  Component Value Date   CHOLHDL 4.9 07/08/2012   No results found for this basename: LDLDIRECT         Studies: IMPRESSION:  1. Heterogeneous atherosclerotic plaque results of 50 - 69% estimated diameter stenosis in the right  internal carotid artery.  2. Moderate heterogeneous atherosclerotic plaque results in less than 50% diameter stenosis in the left internal carotid artery.  3. Greater than 50% diameter stenoses of the bilateral external carotid arteries.  4. Vertebral arteries are patent with antegrade flow bilaterally.   ASSESSMENT AND PLAN:  1. Preoperative risk stratification: Given that the patient gets very little by way of physical activity, and given his underlying history of tobacco abuse, CKD, carotid artery stenosis, history of CVA, and abnormal ECG, I will proceed with a nuclear stress test to provide more comprehensive risk stratification. I will also obtain an echocardiogram to evaluate for structural heart disease  and to evaluate systolic and diastolic function, as well as to assess regional wall motion. He is slightly tachycardic today, and this may be due to his underlying anemia. However, in order to reduce the risk of a perioperative major adverse cardiovascular event, and also to prevent the development of a tachycardia-mediated cardiomyopathy, I will start Toprol-XL 25 mg daily. I asked the patient to inform me should he experience any adverse side effects such as increasing dyspnea, dizziness, lightheadedness, or fatigue. Followup will be determined based on the results of cardiovascular testing. 2. HTN: currently on lisinopril 5 mg daily and mildly elevated. Will monitor as I am starting him on Toprol-XL. 3. Hyperlipidemia: Continue atorvastatin 20 mg daily for both hyperlipidemia as well as for its pleiotropic effects.  Signed: Kate Sable, M.D., F.A.C.C.  03/10/2013, 9:15 AM

## 2013-03-10 NOTE — Telephone Encounter (Signed)
[facility] calling to check on surgical clearance form that was sent over for patient.

## 2013-03-11 NOTE — Telephone Encounter (Signed)
I spoke with Dr. Tresa Moore, letter was dictated printed and faxed along with recent office visit. Patient improved for surgery but awaiting cardiac clearance from cardiology.

## 2013-03-16 ENCOUNTER — Other Ambulatory Visit (HOSPITAL_COMMUNITY): Payer: Medicare PPO

## 2013-03-18 ENCOUNTER — Other Ambulatory Visit (INDEPENDENT_AMBULATORY_CARE_PROVIDER_SITE_OTHER): Payer: Medicare PPO

## 2013-03-18 ENCOUNTER — Other Ambulatory Visit: Payer: Self-pay

## 2013-03-18 ENCOUNTER — Telehealth: Payer: Self-pay | Admitting: *Deleted

## 2013-03-18 ENCOUNTER — Other Ambulatory Visit: Payer: Medicare PPO

## 2013-03-18 DIAGNOSIS — R0609 Other forms of dyspnea: Secondary | ICD-10-CM

## 2013-03-18 DIAGNOSIS — I059 Rheumatic mitral valve disease, unspecified: Secondary | ICD-10-CM

## 2013-03-18 DIAGNOSIS — R609 Edema, unspecified: Secondary | ICD-10-CM

## 2013-03-18 DIAGNOSIS — Z01810 Encounter for preprocedural cardiovascular examination: Secondary | ICD-10-CM

## 2013-03-18 DIAGNOSIS — R0989 Other specified symptoms and signs involving the circulatory and respiratory systems: Secondary | ICD-10-CM

## 2013-03-18 NOTE — Telephone Encounter (Signed)
Patient's wife informed and was given an appointment by Lavella Lemons and Dr. Raliegh Ip on Friday 03/20/13 @ 2:00 pm.

## 2013-03-18 NOTE — Telephone Encounter (Signed)
Message copied by Merlene Laughter on Wed Mar 18, 2013  4:22 PM ------      Message from: Kate Sable A      Created: Wed Mar 18, 2013  4:12 PM       Please schedule a f/u visit with me next week. I am rounding but an afternoon appt should be ok on a day I'm not already scheduled to see other patients. Thanks. ------

## 2013-03-19 ENCOUNTER — Encounter (HOSPITAL_COMMUNITY)
Admission: RE | Admit: 2013-03-19 | Discharge: 2013-03-19 | Disposition: A | Discharge status: Home / Self Care | Payer: Medicare PPO | Source: Ambulatory Visit | Attending: Cardiovascular Disease | Admitting: Cardiovascular Disease

## 2013-03-19 ENCOUNTER — Encounter (HOSPITAL_COMMUNITY): Admission: RE | Admit: 2013-03-19 | Payer: Medicare PPO | Source: Ambulatory Visit

## 2013-03-19 ENCOUNTER — Inpatient Hospital Stay (HOSPITAL_COMMUNITY): Admission: RE | Admit: 2013-03-19 | Payer: Medicare PPO | Source: Ambulatory Visit

## 2013-03-19 ENCOUNTER — Encounter (HOSPITAL_COMMUNITY): Payer: Self-pay

## 2013-03-19 DIAGNOSIS — I2510 Atherosclerotic heart disease of native coronary artery without angina pectoris: Secondary | ICD-10-CM

## 2013-03-19 DIAGNOSIS — R0609 Other forms of dyspnea: Secondary | ICD-10-CM | POA: Insufficient documentation

## 2013-03-19 DIAGNOSIS — Z01810 Encounter for preprocedural cardiovascular examination: Secondary | ICD-10-CM | POA: Insufficient documentation

## 2013-03-19 DIAGNOSIS — I252 Old myocardial infarction: Secondary | ICD-10-CM | POA: Insufficient documentation

## 2013-03-19 DIAGNOSIS — R0989 Other specified symptoms and signs involving the circulatory and respiratory systems: Secondary | ICD-10-CM | POA: Insufficient documentation

## 2013-03-19 DIAGNOSIS — I447 Left bundle-branch block, unspecified: Secondary | ICD-10-CM | POA: Insufficient documentation

## 2013-03-19 MED ORDER — TECHNETIUM TC 99M SESTAMIBI - CARDIOLITE
30.0000 | Freq: Once | INTRAVENOUS | Status: AC | PRN
  Administered 2013-03-19: 30 via INTRAVENOUS

## 2013-03-19 MED ORDER — TECHNETIUM TC 99M SESTAMIBI GENERIC - CARDIOLITE
10.0000 | Freq: Once | INTRAVENOUS | Status: AC | PRN
  Administered 2013-03-19: 10 via INTRAVENOUS

## 2013-03-19 MED ORDER — SODIUM CHLORIDE 0.9 % IJ SOLN
INTRAMUSCULAR | Status: AC
  Administered 2013-03-19: 10 mL via INTRAVENOUS
  Filled 2013-03-19: qty 10

## 2013-03-19 MED ORDER — REGADENOSON 0.4 MG/5ML IV SOLN
INTRAVENOUS | Status: AC
  Administered 2013-03-19: 0.4 mg via INTRAVENOUS
  Filled 2013-03-19: qty 5

## 2013-03-19 NOTE — Progress Notes (Signed)
Stress Lab Nurses Notes - [place] 03/19/2013 Reason for doing test: Surgical Clearance Type of test: Wille Glaser Nurse performing test: Gerrit Halls, RN Nuclear Medicine Tech: Melburn Hake Echo Tech: Not Applicable MD performing test: Dr. Jefm Bryant MD: Sallee Lange Test explained and consent signed: yes IV started: 22g jelco, Saline lock flushed, No redness or edema and Saline lock started in radiology Symptoms: Dizziness Treatment/Intervention: None Reason test stopped: protocol completed After recovery IV was: Discontinued via X-ray tech and No redness or edema Patient to return to [employer]. Med at : 14:00 Patient discharged: Home Patient's Condition upon discharge was: stable Comments: During test BP 158/80 & HR 114.  Recovery BP 138/78 & HR 95.  Symptoms resolved in recovery. Geanie Cooley T

## 2013-03-20 ENCOUNTER — Encounter: Payer: Self-pay | Admitting: Cardiovascular Disease

## 2013-03-20 ENCOUNTER — Encounter (HOSPITAL_COMMUNITY): Payer: Self-pay | Admitting: Pharmacy Technician

## 2013-03-20 ENCOUNTER — Ambulatory Visit (INDEPENDENT_AMBULATORY_CARE_PROVIDER_SITE_OTHER): Payer: Medicare PPO | Admitting: Cardiovascular Disease

## 2013-03-20 VITALS — BP 135/66 | HR 83 | Ht 73.0 in | Wt 246.0 lb

## 2013-03-20 DIAGNOSIS — I6529 Occlusion and stenosis of unspecified carotid artery: Secondary | ICD-10-CM

## 2013-03-20 DIAGNOSIS — I429 Cardiomyopathy, unspecified: Secondary | ICD-10-CM

## 2013-03-20 DIAGNOSIS — F17200 Nicotine dependence, unspecified, uncomplicated: Secondary | ICD-10-CM

## 2013-03-20 DIAGNOSIS — I5022 Chronic systolic (congestive) heart failure: Secondary | ICD-10-CM

## 2013-03-20 DIAGNOSIS — Z01810 Encounter for preprocedural cardiovascular examination: Secondary | ICD-10-CM

## 2013-03-20 DIAGNOSIS — I658 Occlusion and stenosis of other precerebral arteries: Secondary | ICD-10-CM

## 2013-03-20 DIAGNOSIS — I10 Essential (primary) hypertension: Secondary | ICD-10-CM

## 2013-03-20 DIAGNOSIS — R9439 Abnormal result of other cardiovascular function study: Secondary | ICD-10-CM

## 2013-03-20 DIAGNOSIS — I6523 Occlusion and stenosis of bilateral carotid arteries: Secondary | ICD-10-CM

## 2013-03-20 DIAGNOSIS — N183 Chronic kidney disease, stage 3 unspecified: Secondary | ICD-10-CM

## 2013-03-20 DIAGNOSIS — Z8673 Personal history of transient ischemic attack (TIA), and cerebral infarction without residual deficits: Secondary | ICD-10-CM

## 2013-03-20 DIAGNOSIS — R931 Abnormal findings on diagnostic imaging of heart and coronary circulation: Secondary | ICD-10-CM

## 2013-03-20 DIAGNOSIS — E785 Hyperlipidemia, unspecified: Secondary | ICD-10-CM

## 2013-03-20 DIAGNOSIS — I428 Other cardiomyopathies: Secondary | ICD-10-CM

## 2013-03-20 DIAGNOSIS — Z720 Tobacco use: Secondary | ICD-10-CM

## 2013-03-20 NOTE — Patient Instructions (Signed)
Your physician recommends that you schedule a follow-up appointment in: 3 months   Your physician recommends that you continue on your current medications as directed. Please refer to the Current Medication list given to you today.    Thanks for choosing [facility] !

## 2013-03-20 NOTE — Progress Notes (Signed)
Patient ID: Gregory Santana, male   DOB: 29-Jul-1940, 73 y.o.   MRN: [id number]      SUBJECTIVE: The patient is here to followup on the results of cardiovascular testing performed as part of a comprehensive preoperative evaluation. He has a history of bladder cancer and is to undergo extensive surgery for this. He also has a history of hypertension, CVA, CKD stage III, hyperlipidemia, probable COPD, and carotid artery stenosis.  He gets very little physical activity and mostly sits around all day. He denies exertional chest pain and palpitations. He occasionally gets lightheaded if he stands up too quickly but denies syncope. He does not have stairs at home and denies having climbed a flight of stairs in the past several weeks. He denies exertional dyspnea but does not walk quickly. He occasionally has bilateral feet swelling.  He denies paroxysmal nocturnal dyspnea. He sleeps in a recliner and has done so for 20 years as he suffered a stroke approximately 18-20 years ago while lying in bed, and has been afraid to sleep lying in bed since that time.  At his last visit, he was slightly tachycardic at 103 beats per minute. Thus, in order to reduce the risk of a perioperative major adverse cardiovascular event, and also to prevent the development of a tachycardia-mediated cardiomyopathy, I started him on Toprol-XL 25 mg daily. He has not experienced any untoward effects from this. He does not recall ever having a myocardial infarction.    No Known Allergies  Current Outpatient Prescriptions  Medication Sig Dispense Refill  . atorvastatin (LIPITOR) 20 MG tablet TAKE ONE TABLET DAILY.  30 tablet  5  . ibuprofen (ADVIL,MOTRIN) 600 MG tablet Take 600 mg by mouth every 4 (four) hours as needed.       Marland Kitchen lisinopril (PRINIVIL,ZESTRIL) 5 MG tablet CURRENTLY HOLDING FOR SURGERY PER PMD      . LORazepam (ATIVAN) 0.5 MG tablet Take 0.5 mg by mouth at bedtime as needed for sleep.      . metoprolol succinate  (TOPROL-XL) 25 MG 24 hr tablet Take 1 tablet (25 mg total) by mouth daily.  30 tablet  6  . traMADol (ULTRAM) 50 MG tablet Take 1 tablet (50 mg total) by mouth every 6 (six) hours as needed. For Cancer Pain  40 tablet  0   No current facility-administered medications for this visit.    Past Medical History  Diagnosis Date  . Hypertension   . Hyperlipidemia   . Stroke 2002    no deficits  . Cancer     bladder  . Headache(784.0)     migraines  . Psoriasis 1/15    abdomen, sides of chest    Past Surgical History  Procedure Laterality Date  . Hernia repair Left     inguinal  . Transurethral resection of bladder tumor with gyrus (turbt-gyrus) N/A 02/06/2013    Procedure: TRANSURETHRAL RESECTION OF BLADDER TUMOR WITH GYRUS (TURBT-GYRUS);  Surgeon: Sebastian Ache, MD;  Location: [facility];  Service: Urology;  Laterality: N/A;  . Cystoscopy w/ ureteral stent placement Right 02/06/2013    Procedure: CYSTOSCOPY WITH RIGHT RETROGRADE PYELOGRAM/RIGHT URETERAL STENT PLACEMENT;  Surgeon: Sebastian Ache, MD;  Location: [facility];  Service: Urology;  Laterality: Right;  . Cystogram N/A 02/06/2013    Procedure: CYSTOGRAM;  Surgeon: Sebastian Ache, MD;  Location: [facility];  Service: Urology;  Laterality: N/A;    History   Social History  . Marital Status: Widowed    Spouse Name: N/A  Number of Children: N/A  . Years of Education: N/A   Occupational History  . Not on file.   Social History Main Topics  . Smoking status: Current Every Day Smoker -- 1.50 packs/day for 50 years    Types: Cigarettes    Start date: 04/17/1957  . Smokeless tobacco: Never Used  . Alcohol Use: No  . Drug Use: No  . Sexual Activity: Not on file   Other Topics Concern  . Not on file   Social History Narrative  . No narrative on file      PHYSICAL EXAM General: NAD Neck: No JVD, no thyromegaly or thyroid nodule.  Lungs: Clear to auscultation bilaterally with normal respiratory effort. CV: Nondisplaced PMI.   Heart regular S1/S2, no S3/S4, no murmur.  No peripheral edema.  No carotid bruit.  Normal pedal pulses.  Abdomen: Soft, nontender, no hepatosplenomegaly, no distention.  Neurologic: Alert and oriented x 3.  Psych: Normal affect. Extremities: No clubbing or cyanosis.   ECG: reviewed and available in electronic records.  Echo - Left ventricle: The cavity size was mildly dilated. Wall thickness was increased in a pattern of mild LVH. Systolic function was moderately to severely reduced. The estimated ejection fraction was in the range of 30% to 35%. There is akinesis of the mid-distalanteroseptal myocardium. There is severe hypokinesis of the mid-distalinferior, inferoseptal, and apical myocardium. Features are consistent with a pseudonormal left ventricular filling pattern, with concomitant abnormal relaxation and increased filling pressure (grade 2 diastolic dysfunction). - Aortic valve: Mildly calcified annulus. Trileaflet; mildly calcified leaflets. No significant regurgitation. Peak gradient: 46mm Hg (S). - Mitral valve: Calcified annulus. Mildly thickened leaflets . Mild regurgitation. - Left atrium: The atrium was moderately dilated. - Right atrium: The atrium was mildly dilated. Central venous pressure: 83mm Hg (est). - Tricuspid valve: Trivial regurgitation. - Pulmonary arteries: PA peak pressure: 53mm Hg (S). - Pericardium, extracardiac: A trivial pericardial effusion was identified. Impressions:  - Mildly dilated left ventricle with mild LVH and LVEF 30-35%, wall motion abnormalities suggestive of ischemic cardiomyopathy. Probable grade 2 diastolic dysfunction. Moderate left atrial enlargement. MAC with mild mitral regurgitation. Mildly sclerotic aortic valve. Trivial tricuspid regurgitation with PASP 32 mmHg. Trivial pericardial effusion.  Stress test IMPRESSION: 1. Abnormal Lexiscan MPI  2. Large area of myocardial scar involving nearly the entire inferior and  lateral walls.  3. Moderate myocardial scar in the anteroseptal wall  4. Left ventricular enlargement with decreased LV systolic function  5. High risk study based on low ejection fraction and multiple areas of prior myocardial infarction. There is no current myocardium at jeopardy.    ASSESSMENT AND PLAN: 1. Ischemic cardiomyopathy and significant myocardial scar seen with nuclear perfusion imaging: Unbeknownst to the patient, it appears he has sustained a large myocardial infarction involving the inferior, lateral, and anteroseptal walls. His left ventricular systolic function is moderate to severely reduced, EF 30-35%. His left ventricular systolic function should be reassessed after 3 months of optimal medical therapy with long-acting metoprolol, which I started at his last visit. He is already on an ACE inhibitor. Given the fact that there will was no ischemia seen, left heart catheterization and coronary angiography is not indicated at this time. Again, he is asymptomatic but gets very little physical activity. He is already on statin therapy. He normally takes aspirin 81 mg daily, but this has been held by his surgeon to minimize the risk of bleeding. 2. Preoperative risk stratification: Given the large area of myocardial scar and  moderate to severely reduced left reticular systolic function, he is at least at a moderate risk for the perioperative development of a major adverse cardiovascular event. I would recommend the continuation of aspirin 81 mg daily (if deemed feasible) and Toprol-XL 25 mg daily throughout the perioperative period to attenuate this risk. 3. HTN: currently on lisinopril 5 mg daily and now well controlled.  4. Hyperlipidemia: Continue atorvastatin 20 mg daily for both hyperlipidemia as well as for its pleiotropic effects, specifically enhancing endothelial function.  Dispo: f/u 3 months.  Kate Sable, M.D., F.A.C.C.

## 2013-03-23 ENCOUNTER — Encounter (HOSPITAL_COMMUNITY): Payer: Self-pay | Admitting: Pharmacy Technician

## 2013-03-24 ENCOUNTER — Encounter (HOSPITAL_COMMUNITY): Payer: Self-pay | Admitting: *Deleted

## 2013-03-24 ENCOUNTER — Other Ambulatory Visit: Payer: Self-pay

## 2013-03-24 ENCOUNTER — Inpatient Hospital Stay (HOSPITAL_COMMUNITY)
Admission: RE | Admit: 2013-03-24 | Discharge: 2013-03-31 | DRG: 654 | Disposition: A | Discharge status: Home Health | Payer: Medicare PPO | Source: Ambulatory Visit | Attending: Urology | Admitting: Urology

## 2013-03-24 DIAGNOSIS — R3989 Other symptoms and signs involving the genitourinary system: Secondary | ICD-10-CM | POA: Diagnosis present

## 2013-03-24 DIAGNOSIS — I10 Essential (primary) hypertension: Secondary | ICD-10-CM | POA: Diagnosis present

## 2013-03-24 DIAGNOSIS — Z8673 Personal history of transient ischemic attack (TIA), and cerebral infarction without residual deficits: Secondary | ICD-10-CM

## 2013-03-24 DIAGNOSIS — R000 Tachycardia, unspecified: Secondary | ICD-10-CM | POA: Diagnosis not present

## 2013-03-24 DIAGNOSIS — N1330 Unspecified hydronephrosis: Secondary | ICD-10-CM | POA: Diagnosis present

## 2013-03-24 DIAGNOSIS — L408 Other psoriasis: Secondary | ICD-10-CM | POA: Diagnosis present

## 2013-03-24 DIAGNOSIS — E876 Hypokalemia: Secondary | ICD-10-CM | POA: Diagnosis not present

## 2013-03-24 DIAGNOSIS — C679 Malignant neoplasm of bladder, unspecified: Principal | ICD-10-CM | POA: Diagnosis present

## 2013-03-24 DIAGNOSIS — Z8249 Family history of ischemic heart disease and other diseases of the circulatory system: Secondary | ICD-10-CM

## 2013-03-24 DIAGNOSIS — K660 Peritoneal adhesions (postprocedural) (postinfection): Secondary | ICD-10-CM | POA: Diagnosis present

## 2013-03-24 DIAGNOSIS — E785 Hyperlipidemia, unspecified: Secondary | ICD-10-CM | POA: Diagnosis present

## 2013-03-24 DIAGNOSIS — Z79899 Other long term (current) drug therapy: Secondary | ICD-10-CM

## 2013-03-24 DIAGNOSIS — R0602 Shortness of breath: Secondary | ICD-10-CM | POA: Diagnosis not present

## 2013-03-24 DIAGNOSIS — F17200 Nicotine dependence, unspecified, uncomplicated: Secondary | ICD-10-CM | POA: Diagnosis present

## 2013-03-24 LAB — CBC
HCT: 40 % (ref 39.0–52.0)
Hemoglobin: 12.8 g/dL — ABNORMAL LOW (ref 13.0–17.0)
MCH: 29.5 pg (ref 26.0–34.0)
MCHC: 32 g/dL (ref 30.0–36.0)
MCV: 92.2 fL (ref 78.0–100.0)
PLATELETS: 327 10*3/uL (ref 150–400)
RBC: 4.34 MIL/uL (ref 4.22–5.81)
RDW: 13.1 % (ref 11.5–15.5)
WBC: 10.7 10*3/uL — AB (ref 4.0–10.5)

## 2013-03-24 LAB — COMPREHENSIVE METABOLIC PANEL
ALBUMIN: 3.5 g/dL (ref 3.5–5.2)
ALK PHOS: 105 U/L (ref 39–117)
ALT: <5 U/L (ref 0–53)
AST: 13 U/L (ref 0–37)
BUN: 12 mg/dL (ref 6–23)
CHLORIDE: 100 meq/L (ref 96–112)
CO2: 25 meq/L (ref 19–32)
Calcium: 9.8 mg/dL (ref 8.4–10.5)
Creatinine, Ser: 1.63 mg/dL — ABNORMAL HIGH (ref 0.50–1.35)
GFR calc Af Amer: 48 mL/min — ABNORMAL LOW (ref >90)
GFR, EST NON AFRICAN AMERICAN: 41 mL/min — AB (ref >90)
Glucose, Bld: 110 mg/dL — ABNORMAL HIGH (ref 70–99)
POTASSIUM: 4.4 meq/L (ref 3.7–5.3)
Sodium: 141 mEq/L (ref 137–147)
Total Bilirubin: 0.5 mg/dL (ref 0.3–1.2)
Total Protein: 8.1 g/dL (ref 6.0–8.3)

## 2013-03-24 LAB — PREPARE RBC (CROSSMATCH)

## 2013-03-24 MED ORDER — METOPROLOL SUCCINATE ER 25 MG PO TB24
25.0000 mg | ORAL_TABLET | Freq: Every day | ORAL | Status: DC
  Administered 2013-03-24 – 2013-03-26 (×2): 25 mg via ORAL
  Filled 2013-03-24 (×3): qty 1

## 2013-03-24 MED ORDER — PEG 3350-KCL-NA BICARB-NACL 420 G PO SOLR
4000.0000 mL | Freq: Once | ORAL | Status: AC
  Administered 2013-03-24: 4000 mL via ORAL

## 2013-03-24 MED ORDER — ALVIMOPAN 12 MG PO CAPS
12.0000 mg | ORAL_CAPSULE | Freq: Once | ORAL | Status: AC
  Administered 2013-03-24: 12 mg via ORAL
  Filled 2013-03-24: qty 1

## 2013-03-24 MED ORDER — FLEET ENEMA 7-19 GM/118ML RE ENEM
1.0000 | ENEMA | Freq: Once | RECTAL | Status: AC
  Administered 2013-03-24: 1 via RECTAL
  Filled 2013-03-24: qty 1

## 2013-03-24 MED ORDER — NICOTINE 14 MG/24HR TD PT24
14.0000 mg | MEDICATED_PATCH | Freq: Every day | TRANSDERMAL | Status: DC
  Administered 2013-03-24 – 2013-03-31 (×7): 14 mg via TRANSDERMAL
  Filled 2013-03-24 (×8): qty 1

## 2013-03-24 MED ORDER — ASPIRIN EC 81 MG PO TBEC
81.0000 mg | DELAYED_RELEASE_TABLET | Freq: Every day | ORAL | Status: DC
  Administered 2013-03-26 – 2013-03-31 (×6): 81 mg via ORAL
  Filled 2013-03-24 (×8): qty 1

## 2013-03-24 MED ORDER — KCL IN DEXTROSE-NACL 10-5-0.45 MEQ/L-%-% IV SOLN
INTRAVENOUS | Status: DC
  Administered 2013-03-24 – 2013-03-25 (×2): via INTRAVENOUS
  Filled 2013-03-24 (×4): qty 1000

## 2013-03-24 MED ORDER — DEXTROSE 5 % IV SOLN
1.0000 g | Freq: Once | INTRAVENOUS | Status: AC
  Administered 2013-03-24: 1 g via INTRAVENOUS
  Filled 2013-03-24: qty 10

## 2013-03-24 MED ORDER — KCL IN DEXTROSE-NACL 10-5-0.45 MEQ/L-%-% IV SOLN
INTRAVENOUS | Status: DC
  Filled 2013-03-24 (×3): qty 1000

## 2013-03-24 NOTE — Care Management Note (Signed)
    Page 1 of 1   03/24/2013     3:44:29 PM   CARE MANAGEMENT NOTE 03/24/2013  Patient:  Gregory Santana, Gregory Santana   Account Number:  [id number]  Date Initiated:  03/24/2013  Documentation initiated by:  Dessa Phi  Subjective/Objective Assessment:   73 Y/O M ADMITTED W/BLADDER CA.     Action/Plan:   FROM HOME.HAS SPOUSE.HAS PCP,PHARMACY.   Anticipated DC Date:  03/26/2013   Anticipated DC Plan:  [place]  CM consult      Choice offered to / List presented to:  C-1 Patient           Status of service:  In process, will continue to follow Medicare Important Message given?   (If response is "NO", the following Medicare IM given date fields will be blank) Date Medicare IM given:   Date Additional Medicare IM given:    Discharge Disposition:    Per UR Regulation:  Reviewed for med. necessity/level of care/duration of stay  If discussed at Long Length of Stay Meetings, dates discussed:    Comments:  03/24/13 Haily Caley RN,BSN NCM [place].WOCN FOLLOWING-OSTOMY.PROVIDED [facility] LIST FOR HHRN.

## 2013-03-24 NOTE — Consult Note (Addendum)
WOC ostomy consult note: Preoperative stoma site selection per Dr. Zettie Pho request. Patient is for OR tomorrow, site selected is for RLQ ileal conduit.  Patient's abdomen viewed in the sitting and standing positions, patient wears his pants and belt rather low.  Site selected is 6cm to the right of the umbilicus and 1cm below in a flat plane.  I have used a surgical skin marking pen and covered the marking with a thin film transpaent dressing. Please note: site would have to be much higher for patient to visualize, but we will be able to work with this site.  Patient presents with his daughter today and she is receptive to learning.  Patient lives with this daughter and will be a supportive caregiver, although she is hopeful he can be independent eventually with his ostomy.  She is provided today with a book and an educational CD.  I will provide a booklet for her father once he returns to the floor from ICU. Patient understands reason for surgery and that he will have an ostomy post operatively.  He identifies the [facility] as a post-operative resource.  I look forward to working with this nice gentleman in the post-operative phase. [facility] nursing team will follow, andwill remain available to this patient, the nursing and surgical teams. . Thanks, Maudie Flakes, MSN, RN, [place], [place], [place] [phone])

## 2013-03-24 NOTE — H&P (Signed)
Gregory Santana is an 73 y.o. male.    Chief Complaint: Pre-Op Robotic Cystectomy  HPI:   1 - Large Volume High-Grade Bladder Cancer, Malignant Hydronephrosis - Large volume papillary tumor by office cyso 12/2012 on evaluation refracotyr voiding symptoms by Dr. Matilde Sprang.  CT 01/2013 with massive volume bladder cancer and bilateral hydro to level of likely tumor. Mildly enlarged bilateral ext. iliac nodes on CT, but no obvious distant disease. Prior 50PY smoker, still smokes.  At recent TURBT, he had massive amt of tumor not amenable to cystoscopic control. Tissue does confirm high-grade. He had Rt JJ stent placed, left UO unable to be visualized due to malignancy.  2 - Lower Urinary Tract Symptoms - Long and progressive history of obstructive and irritative symptoms includeing weak stream and urge incontincne. PVR 390 12/2012 and Qm 71ml/sec. No sig help with alpha blockers.   3 - Prostate Screening: 12/2012 - PSA 2.17 / DRE 50gm  PMH sig for CVA (no deficits), Psorriasis (no immune therapy), HTN. No CAD / MI. No limiting dyspnea.  Today [name] is seen in f/u above, specifically discuss the role of further therapy for his huge volume bladder cancer.  Past Medical History  Diagnosis Date  . Hypertension   . Hyperlipidemia   . Stroke 2002    no deficits  . Cancer     bladder  . Headache(784.0)     migraines  . Psoriasis 1/15    abdomen, sides of chest    Past Surgical History  Procedure Laterality Date  . Hernia repair Left     inguinal  . Transurethral resection of bladder tumor with gyrus (turbt-gyrus) N/A 02/06/2013    Procedure: TRANSURETHRAL RESECTION OF BLADDER TUMOR WITH GYRUS (TURBT-GYRUS);  Surgeon: Alexis Frock, MD;  Location: [facility];  Service: Urology;  Laterality: N/A;  . Cystoscopy w/ ureteral stent placement Right 02/06/2013    Procedure: CYSTOSCOPY WITH RIGHT RETROGRADE PYELOGRAM/RIGHT URETERAL STENT PLACEMENT;  Surgeon: Alexis Frock, MD;  Location: [facility];   Service: Urology;  Laterality: Right;  . Cystogram N/A 02/06/2013    Procedure: CYSTOGRAM;  Surgeon: Alexis Frock, MD;  Location: [facility];  Service: Urology;  Laterality: N/A;    Family History  Problem Relation Age of Onset  . Hypertension Mother    Social History:  reports that he has been smoking Cigarettes.  He started smoking about 55 years ago. He has a 75 pack-year smoking history. He has never used smokeless tobacco. He reports that he does not drink alcohol or use illicit drugs.  Allergies: No Known Allergies  Medications Prior to Admission  Medication Sig Dispense Refill  . atorvastatin (LIPITOR) 20 MG tablet Take 20 mg by mouth daily.      Marland Kitchen LORazepam (ATIVAN) 0.5 MG tablet Take 0.5 mg by mouth at bedtime as needed for sleep.      . metoprolol succinate (TOPROL-XL) 25 MG 24 hr tablet Take 25 mg by mouth every morning.        No results found for this or any previous visit (from the past 48 hour(s)). No results found.  Review of Systems  Constitutional: Negative.  Negative for fever and chills.  HENT: Negative.   Eyes: Negative.   Respiratory: Negative.   Cardiovascular: Negative.   Gastrointestinal: Negative.   Genitourinary: Positive for hematuria. Negative for flank pain.  Musculoskeletal: Negative.   Skin: Negative.   Neurological: Negative.   Endo/Heme/Allergies: Negative.   Psychiatric/Behavioral: Negative.     Blood pressure 137/78, pulse 86,  temperature 97.1 F (36.2 C), temperature source Oral, height 6\' 1"  (1.854 m), weight 111.812 kg (246 lb 8 oz), SpO2 98.00%. Physical Exam  Constitutional: He is oriented to person, place, and time. He appears well-developed and well-nourished.  HENT:  Head: Normocephalic and atraumatic.  Eyes: Pupils are equal, round, and reactive to light.  Neck: Normal range of motion. Neck supple.  Cardiovascular: Normal rate.   Respiratory: Effort normal and breath sounds normal.  GI: Soft. Bowel sounds are normal.   Protuberant abdomen, psoriasis changes (stable), RLQ stoma site marked  Genitourinary: Penis normal.  Musculoskeletal: Normal range of motion.  Neurological: He is alert and oriented to person, place, and time.  Skin: Skin is warm and dry.  Psychiatric: He has a normal mood and affect. His behavior is normal. Judgment and thought content normal.     Assessment/Plan  1 - Large Volume High Grade Bladder Cancer, Malignant Hydronephrosis -   We rediscussed the role of radical cystectomy + lymph node dissection with concomitant prostatectomy in male and hysterectomy / oophorectomy in male and ileal conduit urinary diversion with the overall goal of complete surgical excision (negative margins) and better staging / diagnosis. We specificallyrediscussed alternatives including chemo-radiation, palliative therapies, and the role of neoadjuvant chemotherapy. We then rediscussed surgical approaches including robotic and open techniques with robotic associated with a shorter convalescence. I showed the patient on their abdomen the approximately 4-6 incision (trocar) sites as well as presumed extraction sites with robotic approach as well as possible open incision sites. I also showed them potential sites for the ileal conduit and spent significant time explaining the "plumbing" of this with regards to GI and GU tracts and specific risks of diversion including ureteral stricture. We specifically addressed that there may be need to alter operative plans according to intraopertive findings including conversion to open procedure. We rediscussed specific peri-operative risks including bleeding, infection, deep vein thrombosis, pulmonary embolism, compartment syndrome, nuropathy / neuropraxia, bowel leak, bowel stricture, heart attack, stroke, death, as well as long-term risks such as non-cure / need for additional therapy and need for imaging and lab based post-op surveillance protocols. We rediscussed typical  hospital course of approximately 5-7 day hospitalization, need for peri-operative drains / catheters, and typical post-hospital course with return to most non-strenuous activities by 4 weeks and ability to return to most jobs and more strenuous activity such as exercise by 8 weeks but with complete return to baseline often taking 58mos plus.  After this lengthy and detail discussion, including answering all of the patient's questions to their satisfaction, they have chosen to proceed tomorrow as planned. CMP, CBC, T+C 2 units, bowel prep, pre-op entereg, nicotene patch.  2 - Lower Urinary Tract Symptoms - Likely from large bladder cancer  3 - Prostate Screening - up to date this year, at his age no further screening warranted  Laverda Stribling 03/24/2013, 3:31 PM

## 2013-03-24 NOTE — Anesthesia Preprocedure Evaluation (Addendum)
Anesthesia Evaluation  Patient identified by MRN, date of birth, ID band Patient awake    Reviewed: Allergy & Precautions, H&P , NPO status , Patient's Chart, lab work & pertinent test results  Airway Mallampati: II TM Distance: >3 FB Neck ROM: Full    Dental no notable dental hx.    Pulmonary neg pulmonary ROS, Current Smoker,  breath sounds clear to auscultation  Pulmonary exam normal       Cardiovascular Exercise Tolerance: Poor hypertension, Pt. on medications and Pt. on home beta blockers + CAD and +CHF Rhythm:Regular Rate:Normal  Cardiology note of 03/20/13 reviewed: Patient at "least moderate risk" of a major cardiac event perioperatively.  ECHO and Stress test reviewed.  EF 19-30 %. No ischemia on stress test.  ECG: LBBB. CXR OK with hyperinflation   Neuro/Psych  Headaches, CVA negative psych ROS   GI/Hepatic negative GI ROS, Neg liver ROS,   Endo/Other  negative endocrine ROS  Renal/GU Renal InsufficiencyRenal disease  negative genitourinary   Musculoskeletal negative musculoskeletal ROS (+)   Abdominal (+) + obese,   Peds negative pediatric ROS (+)  Hematology negative hematology ROS (+)   Anesthesia Other Findings   Reproductive/Obstetrics negative OB ROS                         Anesthesia Physical Anesthesia Plan  ASA: IV  Anesthesia Plan: General   Post-op Pain Management:    Induction: Intravenous  Airway Management Planned: Oral ETT  Additional Equipment: CVP, Ultrasound Guidance Line Placement and Arterial line  Intra-op Plan:   Post-operative Plan: Extubation in OR and Possible Post-op intubation/ventilation  Informed Consent: I have reviewed the patients History and Physical, chart, labs and discussed the procedure including the risks, benefits and alternatives for the proposed anesthesia with the patient or authorized representative who has indicated his/her  understanding and acceptance.   Dental advisory given  Plan Discussed with: CRNA  Anesthesia Plan Comments: (Long discussion with patient and patient's daughter about the risks of surgery and anesthesia including heart attack, stroke, death, heart or lung failure, prolonged intubation and ventilation. They both seem to understand the gravity of the situation and wish to proceed. Questions answered.)       Anesthesia Quick Evaluation

## 2013-03-25 ENCOUNTER — Inpatient Hospital Stay (HOSPITAL_COMMUNITY): Payer: Medicare PPO | Admitting: Anesthesiology

## 2013-03-25 ENCOUNTER — Encounter (HOSPITAL_COMMUNITY)
Admission: RE | Disposition: A | Discharge status: Home Health | Payer: Self-pay | Source: Ambulatory Visit | Attending: Urology

## 2013-03-25 ENCOUNTER — Inpatient Hospital Stay (HOSPITAL_COMMUNITY): Payer: Medicare PPO

## 2013-03-25 ENCOUNTER — Encounter (HOSPITAL_COMMUNITY): Payer: Medicare PPO | Admitting: Anesthesiology

## 2013-03-25 HISTORY — PX: ROBOT ASSISTED LAPAROSCOPIC COMPLETE CYSTECT ILEAL CONDUIT: SHX5139

## 2013-03-25 LAB — BASIC METABOLIC PANEL
BUN: 12 mg/dL (ref 6–23)
CALCIUM: 8.1 mg/dL — AB (ref 8.4–10.5)
CO2: 20 meq/L (ref 19–32)
Chloride: 107 mEq/L (ref 96–112)
Creatinine, Ser: 1.75 mg/dL — ABNORMAL HIGH (ref 0.50–1.35)
GFR calc Af Amer: 44 mL/min — ABNORMAL LOW (ref >90)
GFR calc non Af Amer: 38 mL/min — ABNORMAL LOW (ref >90)
Glucose, Bld: 126 mg/dL — ABNORMAL HIGH (ref 70–99)
POTASSIUM: 3.9 meq/L (ref 3.7–5.3)
SODIUM: 141 meq/L (ref 137–147)

## 2013-03-25 LAB — SURGICAL PCR SCREEN
MRSA, PCR: NEGATIVE
Staphylococcus aureus: NEGATIVE

## 2013-03-25 LAB — HEMOGLOBIN AND HEMATOCRIT, BLOOD
HCT: 39.3 % (ref 39.0–52.0)
HEMOGLOBIN: 12.8 g/dL — AB (ref 13.0–17.0)

## 2013-03-25 SURGERY — ROBOTIC ASSISTED LAPAROSCOPIC COMPLETE CYSTECT ILEAL CONDUIT
Anesthesia: General

## 2013-03-25 MED ORDER — BUPIVACAINE 0.25 % ON-Q PUMP SINGLE CATH 300ML
300.0000 mL | INJECTION | Status: DC
  Administered 2013-03-25: 300 mL
  Filled 2013-03-25: qty 300

## 2013-03-25 MED ORDER — EPHEDRINE SULFATE 50 MG/ML IJ SOLN
INTRAMUSCULAR | Status: DC | PRN
  Administered 2013-03-25: 10 mg via INTRAVENOUS
  Administered 2013-03-25: 15 mg via INTRAVENOUS
  Administered 2013-03-25: 10 mg via INTRAVENOUS

## 2013-03-25 MED ORDER — SODIUM CHLORIDE 0.9 % IV SOLN
INTRAVENOUS | Status: DC | PRN
  Administered 2013-03-25 (×4): via INTRAVENOUS

## 2013-03-25 MED ORDER — NEOSTIGMINE METHYLSULFATE 1 MG/ML IJ SOLN
INTRAMUSCULAR | Status: AC
  Filled 2013-03-25: qty 10

## 2013-03-25 MED ORDER — FENTANYL CITRATE 0.05 MG/ML IJ SOLN
INTRAMUSCULAR | Status: AC
  Filled 2013-03-25: qty 5

## 2013-03-25 MED ORDER — NALOXONE HCL 0.4 MG/ML IJ SOLN: 0.4000 mg | INTRAMUSCULAR | Status: DC | PRN

## 2013-03-25 MED ORDER — GLYCOPYRROLATE 0.2 MG/ML IJ SOLN
INTRAMUSCULAR | Status: DC | PRN
  Administered 2013-03-25: .8 mg via INTRAVENOUS
  Administered 2013-03-25 (×2): 0.2 mg via INTRAVENOUS

## 2013-03-25 MED ORDER — LIDOCAINE HCL (CARDIAC) 20 MG/ML IV SOLN
INTRAVENOUS | Status: AC
  Filled 2013-03-25: qty 5

## 2013-03-25 MED ORDER — NEOSTIGMINE METHYLSULFATE 1 MG/ML IJ SOLN
INTRAMUSCULAR | Status: DC | PRN
  Administered 2013-03-25: 5 mg via INTRAVENOUS

## 2013-03-25 MED ORDER — PIPERACILLIN-TAZOBACTAM 3.375 G IVPB
INTRAVENOUS | Status: AC
  Filled 2013-03-25: qty 50

## 2013-03-25 MED ORDER — EPHEDRINE SULFATE 50 MG/ML IJ SOLN
INTRAMUSCULAR | Status: AC
  Filled 2013-03-25: qty 1

## 2013-03-25 MED ORDER — SODIUM CHLORIDE 0.9 % IJ SOLN: 9.0000 mL | INTRAMUSCULAR | Status: DC | PRN

## 2013-03-25 MED ORDER — PIPERACILLIN-TAZOBACTAM 3.375 G IVPB 30 MIN
3.3750 g | Freq: Once | INTRAVENOUS | Status: AC
  Administered 2013-03-25 (×2): 3.375 g via INTRAVENOUS
  Filled 2013-03-25: qty 50

## 2013-03-25 MED ORDER — DIPHENHYDRAMINE HCL 50 MG/ML IJ SOLN: 12.5000 mg | Freq: Four times a day (QID) | INTRAMUSCULAR | Status: DC | PRN

## 2013-03-25 MED ORDER — ETOMIDATE 2 MG/ML IV SOLN
INTRAVENOUS | Status: DC | PRN
  Administered 2013-03-25: 25 mg via INTRAVENOUS

## 2013-03-25 MED ORDER — CISATRACURIUM BESYLATE 20 MG/10ML IV SOLN
INTRAVENOUS | Status: AC
  Filled 2013-03-25: qty 10

## 2013-03-25 MED ORDER — KETAMINE HCL 10 MG/ML IJ SOLN
INTRAMUSCULAR | Status: DC | PRN
  Administered 2013-03-25 (×5): 5 mg via INTRAVENOUS

## 2013-03-25 MED ORDER — ONDANSETRON HCL 4 MG/2ML IJ SOLN
INTRAMUSCULAR | Status: DC | PRN
  Administered 2013-03-25: 4 mg via INTRAVENOUS

## 2013-03-25 MED ORDER — BUPIVACAINE LIPOSOME 1.3 % IJ SUSP
20.0000 mL | Freq: Once | INTRAMUSCULAR | Status: DC
  Filled 2013-03-25: qty 20

## 2013-03-25 MED ORDER — LIDOCAINE HCL (CARDIAC) 20 MG/ML IV SOLN
INTRAVENOUS | Status: DC | PRN
  Administered 2013-03-25: 100 mg via INTRAVENOUS

## 2013-03-25 MED ORDER — LACTATED RINGERS IR SOLN
Status: DC | PRN
  Administered 2013-03-25: 1000 mL

## 2013-03-25 MED ORDER — PHENYLEPHRINE HCL 10 MG/ML IJ SOLN
10.0000 mg | INTRAVENOUS | Status: DC | PRN
  Administered 2013-03-25: 100 ug/min via INTRAVENOUS
  Administered 2013-03-25: 25 ug/min via INTRAVENOUS

## 2013-03-25 MED ORDER — CHLORHEXIDINE GLUCONATE 0.12 % MT SOLN
15.0000 mL | Freq: Two times a day (BID) | OROMUCOSAL | Status: DC
Start: 2013-03-26 — End: 2013-03-28
  Administered 2013-03-26 – 2013-03-28 (×6): 15 mL via OROMUCOSAL
  Filled 2013-03-25 (×8): qty 15

## 2013-03-25 MED ORDER — SODIUM CHLORIDE 0.9 % IV SOLN
INTRAVENOUS | Status: DC | PRN
  Administered 2013-03-25: 08:00:00 via INTRAVENOUS

## 2013-03-25 MED ORDER — ACETAMINOPHEN 10 MG/ML IV SOLN
1000.0000 mg | Freq: Once | INTRAVENOUS | Status: AC
  Administered 2013-03-25: 1000 mg via INTRAVENOUS
  Filled 2013-03-25 (×2): qty 100

## 2013-03-25 MED ORDER — KETAMINE HCL 10 MG/ML IJ SOLN
INTRAMUSCULAR | Status: AC
  Filled 2013-03-25: qty 1

## 2013-03-25 MED ORDER — MIDAZOLAM HCL 2 MG/2ML IJ SOLN
INTRAMUSCULAR | Status: AC
  Filled 2013-03-25: qty 2

## 2013-03-25 MED ORDER — SODIUM CHLORIDE 0.9 % IJ SOLN
INTRAMUSCULAR | Status: AC
  Filled 2013-03-25: qty 20

## 2013-03-25 MED ORDER — HYDROMORPHONE HCL PF 1 MG/ML IJ SOLN
0.2500 mg | INTRAMUSCULAR | Status: DC | PRN
  Administered 2013-03-25: 0.25 mg via INTRAVENOUS

## 2013-03-25 MED ORDER — PROPOFOL 10 MG/ML IV BOLUS
INTRAVENOUS | Status: AC
  Filled 2013-03-25: qty 20

## 2013-03-25 MED ORDER — DOCUSATE SODIUM 100 MG PO CAPS
100.0000 mg | ORAL_CAPSULE | Freq: Two times a day (BID) | ORAL | Status: DC
  Administered 2013-03-26 – 2013-03-30 (×8): 100 mg via ORAL
  Filled 2013-03-25 (×13): qty 1

## 2013-03-25 MED ORDER — GLYCOPYRROLATE 0.2 MG/ML IJ SOLN
INTRAMUSCULAR | Status: AC
  Filled 2013-03-25: qty 1

## 2013-03-25 MED ORDER — CISATRACURIUM BESYLATE 20 MG/10ML IV SOLN
INTRAVENOUS | Status: AC
  Filled 2013-03-25: qty 20

## 2013-03-25 MED ORDER — ONDANSETRON HCL 4 MG/2ML IJ SOLN
INTRAMUSCULAR | Status: AC
  Filled 2013-03-25: qty 2

## 2013-03-25 MED ORDER — DIPHENHYDRAMINE HCL 12.5 MG/5ML PO ELIX
12.5000 mg | ORAL_SOLUTION | Freq: Four times a day (QID) | ORAL | Status: DC | PRN
Start: 2013-03-25 — End: 2013-03-31

## 2013-03-25 MED ORDER — SUCCINYLCHOLINE CHLORIDE 20 MG/ML IJ SOLN
INTRAMUSCULAR | Status: DC | PRN
  Administered 2013-03-25: 150 mg via INTRAVENOUS

## 2013-03-25 MED ORDER — ETOMIDATE 2 MG/ML IV SOLN
INTRAVENOUS | Status: AC
  Filled 2013-03-25: qty 10

## 2013-03-25 MED ORDER — ATROPINE SULFATE 0.4 MG/ML IJ SOLN
INTRAMUSCULAR | Status: AC
  Filled 2013-03-25: qty 1

## 2013-03-25 MED ORDER — PROMETHAZINE HCL 25 MG/ML IJ SOLN: 6.2500 mg | INTRAMUSCULAR | Status: DC | PRN

## 2013-03-25 MED ORDER — ALVIMOPAN 12 MG PO CAPS
12.0000 mg | ORAL_CAPSULE | Freq: Two times a day (BID) | ORAL | Status: DC
  Administered 2013-03-26 – 2013-03-30 (×9): 12 mg via ORAL
  Filled 2013-03-25 (×10): qty 1

## 2013-03-25 MED ORDER — HYDROMORPHONE HCL PF 2 MG/ML IJ SOLN
INTRAMUSCULAR | Status: AC
  Filled 2013-03-25: qty 1

## 2013-03-25 MED ORDER — MIDAZOLAM HCL 5 MG/5ML IJ SOLN
INTRAMUSCULAR | Status: DC | PRN
  Administered 2013-03-25: 0.5 mg via INTRAVENOUS
  Administered 2013-03-25: 1 mg via INTRAVENOUS
  Administered 2013-03-25: 0.5 mg via INTRAVENOUS

## 2013-03-25 MED ORDER — BUPIVACAINE ON-Q PAIN PUMP (FOR ORDER SET NO CHG)
INJECTION | Status: AC
  Filled 2013-03-25: qty 1

## 2013-03-25 MED ORDER — STERILE WATER FOR IRRIGATION IR SOLN
Status: DC | PRN
  Administered 2013-03-25: 3000 mL

## 2013-03-25 MED ORDER — FENTANYL CITRATE 0.05 MG/ML IJ SOLN
INTRAMUSCULAR | Status: DC | PRN
  Administered 2013-03-25 (×6): 50 ug via INTRAVENOUS

## 2013-03-25 MED ORDER — SODIUM CHLORIDE 0.9 % IJ SOLN
INTRAMUSCULAR | Status: AC
  Filled 2013-03-25: qty 10

## 2013-03-25 MED ORDER — PHENYLEPHRINE HCL 10 MG/ML IJ SOLN
INTRAMUSCULAR | Status: DC | PRN
  Administered 2013-03-25 (×2): 80 ug via INTRAVENOUS
  Administered 2013-03-25: 120 ug via INTRAVENOUS

## 2013-03-25 MED ORDER — HYDROMORPHONE 0.3 MG/ML IV SOLN
INTRAVENOUS | Status: DC
  Administered 2013-03-25: 20:00:00 via INTRAVENOUS
  Administered 2013-03-26: 0.9 mg via INTRAVENOUS
  Administered 2013-03-26: 0.6 mg via INTRAVENOUS
  Administered 2013-03-26: 2.3 mg via INTRAVENOUS
  Administered 2013-03-26: 0.6 mg via INTRAVENOUS
  Administered 2013-03-26: 1.8 mg via INTRAVENOUS
  Administered 2013-03-26: 1.2 mg via INTRAVENOUS
  Administered 2013-03-27: 0.3 mg via INTRAVENOUS
  Administered 2013-03-27: 1.2 mg via INTRAVENOUS
  Administered 2013-03-27: 0.3 mg via INTRAVENOUS
  Administered 2013-03-27: 0.9 mg via INTRAVENOUS
  Administered 2013-03-27: 0.6 mg via INTRAVENOUS
  Administered 2013-03-27 (×2): 2.1 mg via INTRAVENOUS
  Administered 2013-03-28: 0.9 mg via INTRAVENOUS
  Administered 2013-03-28: 2.4 mg via INTRAVENOUS
  Administered 2013-03-28: 1.2 mg via INTRAVENOUS
  Administered 2013-03-28 – 2013-03-29 (×2): 0.3 mg via INTRAVENOUS
  Administered 2013-03-29: 1.8 mg via INTRAVENOUS
  Filled 2013-03-25 (×4): qty 25

## 2013-03-25 MED ORDER — BIOTENE DRY MOUTH MT LIQD
15.0000 mL | Freq: Two times a day (BID) | OROMUCOSAL | Status: DC
  Administered 2013-03-26 – 2013-03-30 (×9): 15 mL via OROMUCOSAL

## 2013-03-25 MED ORDER — HYDROMORPHONE HCL PF 1 MG/ML IJ SOLN
INTRAMUSCULAR | Status: DC | PRN
  Administered 2013-03-25 (×2): .5 mg via INTRAVENOUS
  Administered 2013-03-25: 0.5 mg via INTRAVENOUS
  Administered 2013-03-25: .5 mg via INTRAVENOUS

## 2013-03-25 MED ORDER — PIPERACILLIN-TAZOBACTAM 3.375 G IVPB
3.3750 g | Freq: Three times a day (TID) | INTRAVENOUS | Status: AC
  Administered 2013-03-25 – 2013-03-28 (×9): 3.375 g via INTRAVENOUS
  Filled 2013-03-25 (×9): qty 50

## 2013-03-25 MED ORDER — GLYCOPYRROLATE 0.2 MG/ML IJ SOLN
INTRAMUSCULAR | Status: AC
  Filled 2013-03-25: qty 3

## 2013-03-25 MED ORDER — SENNA 8.6 MG PO TABS
1.0000 | ORAL_TABLET | Freq: Two times a day (BID) | ORAL | Status: DC
  Administered 2013-03-26 – 2013-03-30 (×7): 8.6 mg via ORAL
  Filled 2013-03-25 (×8): qty 1

## 2013-03-25 MED ORDER — HEPARIN SODIUM (PORCINE) 5000 UNIT/ML IJ SOLN
5000.0000 [IU] | Freq: Three times a day (TID) | INTRAMUSCULAR | Status: DC
  Administered 2013-03-25 – 2013-03-31 (×17): 5000 [IU] via SUBCUTANEOUS
  Filled 2013-03-25 (×20): qty 1

## 2013-03-25 MED ORDER — CISATRACURIUM BESYLATE (PF) 10 MG/5ML IV SOLN
INTRAVENOUS | Status: DC | PRN
  Administered 2013-03-25: 5 mg via INTRAVENOUS
  Administered 2013-03-25: 6 mg via INTRAVENOUS
  Administered 2013-03-25: 1 mg via INTRAVENOUS
  Administered 2013-03-25: 5 mg via INTRAVENOUS
  Administered 2013-03-25: 10 mg via INTRAVENOUS
  Administered 2013-03-25: 2 mg via INTRAVENOUS
  Administered 2013-03-25: 4 mg via INTRAVENOUS
  Administered 2013-03-25: 2 mg via INTRAVENOUS
  Administered 2013-03-25: 4 mg via INTRAVENOUS
  Administered 2013-03-25: 10 mg via INTRAVENOUS

## 2013-03-25 MED ORDER — KCL IN DEXTROSE-NACL 10-5-0.45 MEQ/L-%-% IV SOLN
INTRAVENOUS | Status: DC
  Administered 2013-03-25 – 2013-03-26 (×3): via INTRAVENOUS
  Administered 2013-03-27: 125 mL/h via INTRAVENOUS
  Administered 2013-03-27 – 2013-03-29 (×7): via INTRAVENOUS
  Administered 2013-03-30: 20 mL/h via INTRAVENOUS
  Filled 2013-03-25 (×18): qty 1000

## 2013-03-25 MED ORDER — ONDANSETRON HCL 4 MG/2ML IJ SOLN
4.0000 mg | Freq: Four times a day (QID) | INTRAMUSCULAR | Status: DC | PRN
Start: 2013-03-25 — End: 2013-03-31

## 2013-03-25 MED ORDER — ONDANSETRON HCL 4 MG/2ML IJ SOLN: 4.0000 mg | INTRAMUSCULAR | Status: DC | PRN

## 2013-03-25 MED ORDER — LACTATED RINGERS IV SOLN: INTRAVENOUS | Status: DC | PRN

## 2013-03-25 MED ORDER — PHENYLEPHRINE 40 MCG/ML (10ML) SYRINGE FOR IV PUSH (FOR BLOOD PRESSURE SUPPORT)
PREFILLED_SYRINGE | INTRAVENOUS | Status: AC
  Filled 2013-03-25: qty 20

## 2013-03-25 MED ORDER — PHENYLEPHRINE HCL 10 MG/ML IJ SOLN
INTRAMUSCULAR | Status: AC
  Filled 2013-03-25: qty 2

## 2013-03-25 MED ORDER — HYDROMORPHONE HCL PF 1 MG/ML IJ SOLN
INTRAMUSCULAR | Status: AC
Start: 2013-03-25 — End: 2013-03-26
  Filled 2013-03-25: qty 1

## 2013-03-25 SURGICAL SUPPLY — 95 items
ADH SKN CLS APL DERMABOND .7 (GAUZE/BANDAGES/DRESSINGS) ×1
APL ESCP 34 STRL LF DISP (HEMOSTASIS)
APPLICATOR COTTON TIP 6IN STRL (MISCELLANEOUS) ×1 IMPLANT
APPLICATOR SURGIFLO ENDO (HEMOSTASIS) ×1 IMPLANT
BAG SPEC RTRVL LRG 6X4 10 (ENDOMECHANICALS)
BAG URO CATCHER STRL LF (DRAPE) ×1 IMPLANT
BLADE SURG SZ10 CARB STEEL (BLADE) ×2 IMPLANT
CABLE HIGH FREQUENCY MONO STRZ (ELECTRODE) ×3 IMPLANT
CANISTER SUCT LVC 12 LTR MEDI- (MISCELLANEOUS) ×1 IMPLANT
CANISTER SUCTION 2500CC (MISCELLANEOUS) ×1 IMPLANT
CATH FOLEY 2WAY SLVR  5CC 16FR (CATHETERS) ×2
CATH FOLEY 2WAY SLVR 5CC 16FR (CATHETERS) ×1 IMPLANT
CATH KIT ON Q 7.5IN SLV (PAIN MANAGEMENT) ×2 IMPLANT
CATH ROBINSON RED A/P 16FR (CATHETERS) ×1 IMPLANT
CHLORAPREP W/TINT 26ML (MISCELLANEOUS) ×3 IMPLANT
CLIP LIGATING HEM O LOK PURPLE (MISCELLANEOUS) ×10 IMPLANT
CLIP LIGATING HEMO LOK XL GOLD (MISCELLANEOUS) ×10 IMPLANT
COVER MAYO STAND STRL (DRAPES) ×3 IMPLANT
COVER PROBE U/S 5X48 (MISCELLANEOUS) ×2 IMPLANT
COVER SURGICAL LIGHT HANDLE (MISCELLANEOUS) ×3 IMPLANT
COVER TIP SHEARS 8 DVNC (MISCELLANEOUS) ×1 IMPLANT
COVER TIP SHEARS 8MM DA VINCI (MISCELLANEOUS) ×2
DECANTER SPIKE VIAL GLASS SM (MISCELLANEOUS) ×1 IMPLANT
DERMABOND ADVANCED (GAUZE/BANDAGES/DRESSINGS) ×2
DERMABOND ADVANCED .7 DNX12 (GAUZE/BANDAGES/DRESSINGS) ×2 IMPLANT
DRAIN CHANNEL 15F RND FF 3/16 (WOUND CARE) ×1 IMPLANT
DRAPE TABLE BACK 44X90 PK DISP (DRAPES) ×3 IMPLANT
DRAPE WARM FLUID 44X44 (DRAPE) ×21 IMPLANT
DRSG TEGADERM 6X8 (GAUZE/BANDAGES/DRESSINGS) ×6 IMPLANT
ELECT CAUTERY BLADE 6.4 (BLADE) ×3 IMPLANT
ELECT REM PT RETURN 9FT ADLT (ELECTROSURGICAL) ×3
ELECTRODE REM PT RTRN 9FT ADLT (ELECTROSURGICAL) ×1 IMPLANT
EVACUATOR DRAINAGE 7X20 100CC (MISCELLANEOUS) ×1 IMPLANT
EVACUATOR SILICONE 100CC (MISCELLANEOUS)
GLOVE BIO SURGEON STRL SZ 6.5 (GLOVE) ×3 IMPLANT
GLOVE BIO SURGEONS STRL SZ 6.5 (GLOVE) ×2
GLOVE BIOGEL M STRL SZ7.5 (GLOVE) ×7 IMPLANT
GOWN STRL REUS W/TWL LRG LVL3 (GOWN DISPOSABLE) ×12 IMPLANT
GOWN STRL REUS W/TWL XL LVL3 (GOWN DISPOSABLE) ×2 IMPLANT
GUIDEWIRE ANG ZIPWIRE 038X150 (WIRE) IMPLANT
HOLDER FOLEY CATH W/STRAP (MISCELLANEOUS) ×3 IMPLANT
KIT ACCESSORY DA VINCI DISP (KITS) ×2
KIT ACCESSORY DVNC DISP (KITS) ×1 IMPLANT
KIT PROCEDURE DA VINCI SI (MISCELLANEOUS)
KIT PROCEDURE DVNC SI (MISCELLANEOUS) ×1 IMPLANT
LOOP MINI RED (MISCELLANEOUS) ×3 IMPLANT
LOOP VESSEL MAXI BLUE (MISCELLANEOUS) ×3 IMPLANT
MANIFOLD NEPTUNE II (INSTRUMENTS) ×2 IMPLANT
NDL INSUFFLATION 14GA 120MM (NEEDLE) ×1 IMPLANT
NEEDLE INSUFFLATION 14GA 120MM (NEEDLE) ×3 IMPLANT
PACK CYSTO (CUSTOM PROCEDURE TRAY) ×1 IMPLANT
PACK ROBOT UROLOGY CUSTOM (CUSTOM PROCEDURE TRAY) ×3 IMPLANT
POSITIONER SURGICAL ARM (MISCELLANEOUS) ×2 IMPLANT
POUCH ENDO CATCH II 15MM (MISCELLANEOUS) ×3 IMPLANT
POUCH SPECIMEN RETRIEVAL 10MM (ENDOMECHANICALS) IMPLANT
PUMP PAIN ON-Q (MISCELLANEOUS) ×3 IMPLANT
RELOAD GOLD (STAPLE) ×1 IMPLANT
RELOAD GREEN (STAPLE) ×11 IMPLANT
RELOAD WHITE ECR60W (STAPLE) ×20 IMPLANT
SET TUBE IRRIG SUCTION NO TIP (IRRIGATION / IRRIGATOR) ×3 IMPLANT
SOLUTION ELECTROLUBE (MISCELLANEOUS) ×3 IMPLANT
SPONGE LAP 18X18 X RAY DECT (DISPOSABLE) IMPLANT
SPONGE LAP 4X18 X RAY DECT (DISPOSABLE) ×3 IMPLANT
STAPLE ECHEON FLEX 60 POW ENDO (STAPLE) ×3 IMPLANT
STENT SET URETHERAL LEFT 7FR (STENTS) ×3 IMPLANT
STENT SET URETHERAL RIGHT 7FR (STENTS) ×3 IMPLANT
SURGIFLO W/THROMBIN 8M KIT (HEMOSTASIS) ×1 IMPLANT
SUT CHROMIC 4 0 RB 1X27 (SUTURE) ×3 IMPLANT
SUT ETHILON 3 0 PS 1 (SUTURE) ×3 IMPLANT
SUT MNCRL AB 4-0 PS2 18 (SUTURE) ×6 IMPLANT
SUT MON AB 5-0 RB1 27 (SUTURE) ×3 IMPLANT
SUT PDS AB 0 CTX 36 PDP370T (SUTURE) ×10 IMPLANT
SUT SILK 3 0 SH 30 (SUTURE) ×10 IMPLANT
SUT SILK 3 0 SH CR/8 (SUTURE) ×3 IMPLANT
SUT VIC AB 2-0 UR5 27 (SUTURE) ×2 IMPLANT
SUT VIC AB 2-0 UR6 27 (SUTURE) ×4 IMPLANT
SUT VIC AB 3-0 SH 27 (SUTURE) ×6
SUT VIC AB 3-0 SH 27X BRD (SUTURE) ×2 IMPLANT
SUT VIC AB 4-0 PS2 18 (SUTURE) ×2 IMPLANT
SUT VIC AB 4-0 RB1 27 (SUTURE) ×18
SUT VIC AB 4-0 RB1 27XBRD (SUTURE) ×1 IMPLANT
SUT VICRYL 0 UR6 27IN ABS (SUTURE) ×12 IMPLANT
SUT VLOC BARB 180 ABS3/0GR12 (SUTURE) ×3
SUTURE VLOC BRB 180 ABS3/0GR12 (SUTURE) ×1 IMPLANT
SYSTEM UROSTOMY GENTLE TOUCH (WOUND CARE) ×3 IMPLANT
TOWEL NATURAL 10PK STERILE (DISPOSABLE) ×5 IMPLANT
TOWEL OR NON WOVEN STRL DISP B (DISPOSABLE) ×3 IMPLANT
TRAY FOLEY CATH 16FRSI W/METER (SET/KITS/TRAYS/PACK) ×1 IMPLANT
TROCAR 12M 150ML BLUNT (TROCAR) ×3 IMPLANT
TROCAR BLADELESS 15MM (ENDOMECHANICALS) ×3 IMPLANT
TUNNELER SHEATH ON-Q 16GX12 DP (PAIN MANAGEMENT) ×2 IMPLANT
URINEMETER 200ML W/220 (MISCELLANEOUS) ×1 IMPLANT
WATER STERILE IRR 1000ML UROMA (IV SOLUTION) ×1 IMPLANT
WATER STERILE IRR 1500ML POUR (IV SOLUTION) ×2 IMPLANT
YANKAUER SUCT BULB TIP 10FT TU (MISCELLANEOUS) ×2 IMPLANT

## 2013-03-25 NOTE — Anesthesia Postprocedure Evaluation (Signed)
  Anesthesia Post-op Note  Patient: Gregory Santana  Procedure(s) Performed: Procedure(s) (LRB): ROBOTIC ASSISTED LAPAROSCOPIC COMPLETE CYSTECT ILEAL CONDUIT, EXTENSIVE ADHESIOLYSIS (N/A)  Patient Location: PACU  Anesthesia Type: General  Level of Consciousness: awake and alert   Airway and Oxygen Therapy: Patient Spontanous Breathing  Post-op Pain: mild  Post-op Assessment: Post-op Vital signs reviewed, Patient's Cardiovascular Status Stable, Respiratory Function Stable, Patent Airway and No signs of Nausea or vomiting  Last Vitals:  Filed Vitals:   03/25/13 1912  BP:   Pulse:   Temp:   Resp: 22    Post-op Vital Signs: stable   Complications: No apparent anesthesia complications. CXR reviewed. No pneumothorax. CVC in SVC. Possible pulmonary edema.

## 2013-03-25 NOTE — Brief Op Note (Signed)
03/24/2013 - 03/25/2013  4:44 PM  PATIENT:  Gregory Santana  73 y.o. male  PRE-OPERATIVE DIAGNOSIS:  BLADDER CANCER  POST-OPERATIVE DIAGNOSIS:  BLADDER CANCER  PROCEDURE:  Procedure(s): ROBOTIC ASSISTED LAPAROSCOPIC COMPLETE CYSTECT ILEAL CONDUIT, EXTENSIVE ADHESIOLYSIS (N/A)  SURGEON:  Surgeon(s) and Role:    * Alexis Frock, MD - Primary  PHYSICIAN ASSISTANT:   ASSISTANTS: Felipa Furnace, PA   ANESTHESIA:   general  EBL:  Total I/O In: 2800 [I.V.:2800] Out: 350 [Urine:50; Blood:300]  BLOOD ADMINISTERED:none  DRAINS: Urostomy with Bander Stents in RLQ, JP to bulb suction in RLQ, Pain pump from inferior wound edge   LOCAL MEDICATIONS USED:  MARCAINE     SPECIMEN:  Source of Specimen:  1 - Bilateral Pelvic Lymph Nodes, 2 - Bilateral Distal Ureteral Margins, 3 - Cystoprostatectomy  DISPOSITION OF SPECIMEN:  PATHOLOGY  COUNTS:  YES  TOURNIQUET:  * No tourniquets in log *  DICTATION: .Other Dictation: Dictation Number [id number]  PLAN OF CARE: Admit to inpatient   PATIENT DISPOSITION:  PACU - hemodynamically stable.   Delay start of Pharmacological VTE agent (>24hrs) due to surgical blood loss or risk of bleeding: no

## 2013-03-25 NOTE — Progress Notes (Signed)
Day of Surgery  Subjective:  1 - Large Volume High-Grade Bladder Cancer, Malignant Hydronephrosis - Large volume papillary tumor by office cyso 12/2012 on evaluation refracotyr voiding symptoms by Dr. Matilde Sprang. CT 01/2013 with massive volume bladder cancer and bilateral hydro to level of likely tumor. Mildly enlarged bilateral ext. iliac nodes on CT, but no obvious distant disease. Prior 50PY smoker, still smokes.  At recent TURBT, he had massive amt of tumor not amenable to cystoscopic control. Tissue does confirm high-grade. He had Rt JJ stent placed, left UO unable to be visualized due to malignancy.   2 - Lower Urinary Tract Symptoms - Long and progressive history of obstructive and irritative symptoms includeing weak stream and urge incontincne. PVR 390 12/2012 and Qm 11ml/sec. No sig help with alpha blockers.   3 - Prostate Screening:  12/2012 - PSA 2.17 / DRE 50gm   Today Mataio is seen to proceed with cystectomy and ileal conduit urinary diversion. His nuc med stress test did reveal likely prior MI and he has been place on B-Blocker and ASA 81 as recommended by his cardiologist perioperatively. No inducible ischemia was noted on his stress test. He completed bowel prep to clear last night, hid daughter is with him this AM.   Objective: Vital signs in last 24 hours: Temp:  [97.1 F (36.2 C)-97.9 F (36.6 C)] 97.3 F (36.3 C) (02/25 0520) Pulse Rate:  [75-86] 75 (02/25 0520) Resp:  [14-18] 18 (02/25 0520) BP: (125-137)/(69-78) 125/74 mmHg (02/25 0520) SpO2:  [98 %-100 %] 98 % (02/25 0520) Weight:  [111.812 kg (246 lb 8 oz)] 111.812 kg (246 lb 8 oz) (02/24 1437) Last BM Date: 03/24/13  Intake/Output from previous day: 02/24 0701 - 02/25 0700 In: 2455 [P.O.:1440; I.V.:1015] Out: 1 [Stool:1] Intake/Output this shift:    General appearance: alert, cooperative, appears stated age and daughter at bedside Head: Normocephalic, without obvious abnormality, atraumatic Eyes: no  disconjugate gaze Ears: normal exteral appearance Nose: Nares normal. Septum midline. Mucosa normal. No drainage or sinus tenderness. Back: symmetric, no curvature. ROM normal. No CVA tenderness. Resp: no audible wheezes or labored breathing. Chest wall: no tenderness Cardio: regular rate and rhythm, S1, S2 normal, no murmur, click, rub or gallop GI: soft, non-tender; bowel sounds normal; no masses,  no organomegaly Male genitalia: normal Extremities: extremities normal, atraumatic, no cyanosis or edema Pulses: 2+ and symmetric Skin: Skin color, texture, turgor normal. No rashes or lesions or stable psorriasis Lymph nodes: Cervical, supraclavicular, and axillary nodes normal. Neurologic: Grossly normal  Lab Results:   Recent Labs  03/24/13 1838  WBC 10.7*  HGB 12.8*  HCT 40.0  PLT 327   BMET  Recent Labs  03/24/13 1838  NA 141  K 4.4  CL 100  CO2 25  GLUCOSE 110*  BUN 12  CREATININE 1.63*  CALCIUM 9.8   PT/INR No results found for this basename: LABPROT, INR,  in the last 72 hours ABG No results found for this basename: PHART, PCO2, PO2, HCO3,  in the last 72 hours  Studies/Results: No results found.  Anti-infectives: Anti-infectives   Start     Dose/Rate Route Frequency Ordered Stop   03/24/13 1500  cefTRIAXone (ROCEPHIN) 1 g in dextrose 5 % 50 mL IVPB     1 g 100 mL/hr over 30 Minutes Intravenous  Once 03/24/13 1451 03/24/13 1630      Assessment/Plan:  1 - Large Volume High-Grade Bladder Cancer, Malignant Hydronephrosis - Proceed today as planned with cystectomy and ileal conduit.  Pt has been counseled extensively on risks and benefits and he wishes to proceed. We will continue B-blocker and ASA 81 peri-operatively. He has blood products available. Will plan on stepdown / ICU tonight.    [facility], Rayelynn Loyal 03/25/2013

## 2013-03-25 NOTE — Transfer of Care (Signed)
Immediate Anesthesia Transfer of Care Note  Patient: Gregory Santana  Procedure(s) Performed: Procedure(s) (LRB): ROBOTIC ASSISTED LAPAROSCOPIC COMPLETE CYSTECT ILEAL CONDUIT, EXTENSIVE ADHESIOLYSIS (N/A)  Patient Location: ICU  Anesthesia Type: General  Level of Consciousness: sedated, patient cooperative and responds to stimulation  Airway & Oxygen Therapy: Patient Spontanous Breathing and Patient connected to face mask oxgen  Post-op Assessment: Report given to PACU RN and Post -op Vital signs reviewed and stable  Post vital signs: Reviewed and stable  Complications: No apparent anesthesia complications

## 2013-03-25 NOTE — Preoperative (Signed)
Beta Blockers   Reason not to administer Beta Blockers:Not Applicable, Hold beta blocker due to hypotension

## 2013-03-25 NOTE — Anesthesia Procedure Notes (Signed)
Anesthesia Procedure Note Right internal jugular triple-lumen CVC placed under ultrasound guidance and sterile conditions. Sterile gown, gloves, mask worn. Venous blood obtained. CVP tracing appropriate. All three ports flushed with sterile saline. Sutured at 15 cm. Tolerated well. No ectopy noted.

## 2013-03-25 NOTE — Discharge Instructions (Signed)

## 2013-03-26 LAB — BASIC METABOLIC PANEL
BUN: 11 mg/dL (ref 6–23)
CALCIUM: 8.2 mg/dL — AB (ref 8.4–10.5)
CO2: 23 mEq/L (ref 19–32)
CREATININE: 1.8 mg/dL — AB (ref 0.50–1.35)
Chloride: 107 mEq/L (ref 96–112)
GFR calc Af Amer: 42 mL/min — ABNORMAL LOW (ref >90)
GFR calc non Af Amer: 36 mL/min — ABNORMAL LOW (ref >90)
Glucose, Bld: 119 mg/dL — ABNORMAL HIGH (ref 70–99)
Potassium: 4.2 mEq/L (ref 3.7–5.3)
Sodium: 140 mEq/L (ref 137–147)

## 2013-03-26 LAB — HEMOGLOBIN AND HEMATOCRIT, BLOOD
HCT: 36.8 % — ABNORMAL LOW (ref 39.0–52.0)
Hemoglobin: 12 g/dL — ABNORMAL LOW (ref 13.0–17.0)

## 2013-03-26 MED ORDER — ALBUMIN HUMAN 25 % IV SOLN
12.5000 g | Freq: Once | INTRAVENOUS | Status: AC
  Administered 2013-03-26: 12.5 g via INTRAVENOUS
  Filled 2013-03-26: qty 50

## 2013-03-26 MED ORDER — ACETAMINOPHEN 10 MG/ML IV SOLN
1000.0000 mg | Freq: Three times a day (TID) | INTRAVENOUS | Status: AC
  Administered 2013-03-26 – 2013-03-27 (×3): 1000 mg via INTRAVENOUS
  Filled 2013-03-26 (×3): qty 100

## 2013-03-26 MED ORDER — METOPROLOL TARTRATE 1 MG/ML IV SOLN
5.0000 mg | Freq: Four times a day (QID) | INTRAVENOUS | Status: DC
  Administered 2013-03-26 – 2013-03-30 (×18): 5 mg via INTRAVENOUS
  Filled 2013-03-26 (×24): qty 5

## 2013-03-26 NOTE — Evaluation (Signed)
Physical Therapy Evaluation Patient Details Name: Gregory Santana MRN: [id number] DOB: 03/16/1940 Today's Date: 03/26/2013 Time: 8768-1157 PT Time Calculation (min): 34 min  PT Assessment / Plan / Recommendation History of Present Illness  Large Volume High-Grade Bladder Cancer - s/p robotic cystoprostatectomy with ileal conduit urinary diversion and extensive adhesiolysis 03/25/13  Clinical Impression  Pt tolerated mobilizing OOB to recliner. Pt will benefit from PT to address problems listed.    PT Assessment  Patient needs continued PT services    Follow Up Recommendations  SNF;Supervision/Assistance - 24 hour (unless has 24/7 cargivers.)    Does the patient have the potential to tolerate intense rehabilitation      Barriers to Discharge Decreased caregiver support      Equipment Recommendations  Rolling walker with 5" wheels    Recommendations for Other Services     Frequency Min 3X/week    Precautions / Restrictions Restrictions Other Position/Activity Restrictions: Large Volume High-Grade Bladder Cancer - s/p robotic cystoprostatectomy with ileal conduit urinary diversion and extensive adhesiolysis 03/25/13   Pertinent Vitals/Pain Pt reports pain 10, used PCA several; times      Mobility  Bed Mobility Overal bed mobility: +2 for physical assistance;+ 2 for safety/equipment;Needs Assistance Bed Mobility: Rolling;Sidelying to Sit Rolling: +2 for physical assistance;+2 for safety/equipment;Mod assist Sidelying to sit: +2 for safety/equipment;+2 for physical assistance;Max assist;HOB elevated General bed mobility comments: cues to roll and to push up onto elbow. support of trunk to get into upright sitting. Transfers Overall transfer level: Needs assistance Equipment used: Rolling walker (2 wheeled) Transfers: Sit to/from [facility] Sit to Stand: +2 physical assistance;+2 safety/equipment;Mod assist Stand pivot transfers: Mod assist;+2 physical  assistance;+2 safety/equipment General transfer comment: cues for hand palcement,  able to take small steps to recliner.    Exercises     PT Diagnosis: Difficulty walking;Acute pain;Generalized weakness  PT Problem List: Decreased strength;Decreased activity tolerance;Decreased safety awareness;Decreased mobility;Decreased knowledge of precautions;Decreased knowledge of use of DME;Decreased skin integrity;Pain PT Treatment Interventions: DME instruction;Gait training;Stair training;Functional mobility training;Therapeutic activities;Therapeutic exercise;Patient/family education     PT Goals(Current goals can be found in the care plan section) Acute Rehab PT Goals Patient Stated Goal: I will get up PT Goal Formulation: With patient Time For Goal Achievement: 04/09/13 Potential to Achieve Goals: Good  Visit Information  Last PT Received On: 03/26/13 Assistance Needed: +2 History of Present Illness: Large Volume High-Grade Bladder Cancer - s/p robotic cystoprostatectomy with ileal conduit urinary diversion and extensive adhesiolysis 03/25/13       Prior [place] expects to be discharged to:: Private residence Living Arrangements: Children Available Help at Discharge: Family;Available PRN/intermittently Type of Home: House Home Access: Stairs to enter [employer] of Steps: 5 Entrance Stairs-Rails: None Home Layout: One level Home Equipment: None Prior Function Level of Independence: Independent Communication Communication: No difficulties    Cognition  Cognition Arousal/Alertness: Awake/alert Behavior During Therapy: WFL for tasks assessed/performed Overall Cognitive Status: Within Functional Limits for tasks assessed    Extremity/Trunk Assessment Upper Extremity Assessment Upper Extremity Assessment: Generalized weakness Lower Extremity Assessment Lower Extremity Assessment: Generalized weakness Cervical / Trunk  Assessment Cervical / Trunk Assessment: Normal   Balance    End of Session PT - End of Session Activity Tolerance: Patient tolerated treatment well Patient left: in chair;with call bell/phone within reach;with family/visitor present Nurse Communication: Mobility status  GP     Claretha Cooper 03/26/2013, 10:41 AM Tresa Endo PT [phone]

## 2013-03-26 NOTE — Consult Note (Signed)
WOC ostomy consult note Stoma type/location: RLQ ileal conduit Stomal assessment/size: 1 and 1/8 inch round, moist, slightly budded with two stents intact, red in right ureter, blue in left Peristomal assessment: intact, clear Treatment options for stomal/peristomal skin: none indicated Output amber urine Ostomy pouching: 1pc. Convex pouch. Education provided: Patient's daughter and son with patient, who is medicated and drowsy.  Both are eager to learn and are asking appropriate questions. Teaching regarding stoma characteristics, pouch characteristics, pouch change frequency and when to obtain supplies conducted with good understanding by patient's children.  Both are provided an educational booklet this evening. [facility] nursing team will follow, and will remain available to this patient, the nursing and surgical team.  Thanks, Maudie Flakes, MSN, RN, [place], [place], [place] [phone])

## 2013-03-26 NOTE — Progress Notes (Signed)
1 Day Post-Op  Subjective:  1 - Large Volume High-Grade Bladder Cancer - s/p robotic cystoprostatectomy with ileal conduit urinary diversion and extensive adhesiolysis 03/25/13. Stepdown initially post-op.  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   Today Gwyndolyn Saxon c/o some incision site soreness as expected. No overnight events. NO nausea / emesis.   Objective: Vital signs in last 24 hours: Temp:  [97.7 F (36.5 C)-98.4 F (36.9 C)] 98.4 F (36.9 C) (02/26 0400) Pulse Rate:  [89-102] 102 (02/26 0600) Resp:  [17-24] 24 (02/26 0600) BP: (92-136)/(47-74) 107/47 mmHg (02/26 0600) SpO2:  [90 %-100 %] 92 % (02/26 0600) Arterial Line BP: (91-140)/(49-65) 107/60 mmHg (02/26 0600) Last BM Date: 03/25/13  Intake/Output from previous day: 02/25 0701 - 02/26 0700 In: 4890.4 [I.V.:4610.4; IV Piggyback:100] Out: 1985 [Urine:825; Drains:160; Stool:700; Blood:300] Intake/Output this shift:    General appearance: alert, cooperative and appears stated age Head: Normocephalic, without obvious abnormality, atraumatic Eyes: negative Nose: Nares normal. Septum midline. Mucosa normal. No drainage or sinus tenderness. Throat: normal findings: lips normal without lesions Back: symmetric, no curvature. ROM normal. No CVA tenderness. Resp: mild wheezes throughout, no dyspnea. Did spirometery at bedside with instruction. Cardio: mod tachy, regular rythem GI: scan bowel sounds, no distention. RLQ Urostomy pink / patent wtih bander stents in place. Wound c/d/i wiht pain catheter in place. JP with minimal serosanguinous fluid. Male genitalia: normal, no penile drainage / discharge Extremities: extremities normal, atraumatic, no cyanosis or edema and Rt radial A-line in place. RUE warm and perfused distallyi. Pulses: 2+ and symmetric Skin: Skin color, texture, turgor normal. No rashes or lesions Lymph nodes: Cervical, supraclavicular, and axillary  nodes normal. Rt EJ triple lumen c/d/i, no skin site erythema.  Lab Results:   Recent Labs  03/24/13 1838 03/25/13 1807 03/26/13 0440  WBC 10.7*  --   --   HGB 12.8* 12.8* 12.0*  HCT 40.0 39.3 36.8*  PLT 327  --   --    BMET  Recent Labs  03/25/13 1807 03/26/13 0440  NA 141 140  K 3.9 4.2  CL 107 107  CO2 20 23  GLUCOSE 126* 119*  BUN 12 11  CREATININE 1.75* 1.80*  CALCIUM 8.1* 8.2*   PT/INR No results found for this basename: LABPROT, INR,  in the last 72 hours ABG No results found for this basename: PHART, PCO2, PO2, HCO3,  in the last 72 hours  Studies/Results: Dg Chest Port 1 View  03/25/2013   CLINICAL DATA:  Central line.  EXAM: PORTABLE CHEST - 1 VIEW  COMPARISON:  Chest x-ray 02/05/2013.  FINDINGS: Right IJ line noted projected over the superior vena cava. Cardiomegaly with pulmonary vascular prominence and interstitial prominence noted suggesting congestive heart failure. Bilateral pneumonitis cannot be excluded. Basilar atelectasis. No pleural effusion or pneumothorax. No acute bony abnormality.  IMPRESSION: Congestive heart failure with pulmonary edema. Superimposed pneumonitis cannot be excluded. Right IJ line noted projected over superior vena cava.   Electronically Signed   By: Marcello Moores  Register   On: 03/25/2013 18:03    Anti-infectives: Anti-infectives   Start     Dose/Rate Route Frequency Ordered Stop   03/25/13 2000  piperacillin-tazobactam (ZOSYN) IVPB 3.375 g     3.375 g 12.5 mL/hr over 240 Minutes Intravenous Every 8 hours 03/25/13 1912 03/28/13 1959   03/25/13 0830  piperacillin-tazobactam (ZOSYN) IVPB 3.375 g     3.375 g 100 mL/hr over 30 Minutes Intravenous  Once 03/25/13  0263 03/25/13 1403   03/24/13 1500  cefTRIAXone (ROCEPHIN) 1 g in dextrose 5 % 50 mL IVPB     1 g 100 mL/hr over 30 Minutes Intravenous  Once 03/24/13 1451 03/24/13 1630      Assessment/Plan:  1 - Large Volume High-Grade Bladder Cancer - Doing well POD 1 s/p cystectomy,  path pending.   CXR last PM with some likely congestion. As GFR not ideal, will give concentrated albumin to help increase intravascular oncotic pressure / safely diurese. Strongly encouraged spirometry / deep breathing / coughing / ambulating.  DC A-Line, all other tubes / lines to remain.   PT eval.   Remain NPO  Remain stepdown, likely transfer to floor tomorrow  2 - Acute on Chronic Renal Insuficiency - Contiue MIVF, albumin today as well. Likely 2/2 peri-op pre-renal + recent malignant obstruciton, both of which should improve now post-op over next few days.    [facility], Hansford Hirt 03/26/2013

## 2013-03-26 NOTE — Progress Notes (Signed)
03/26/13/Olivia Pavelko,RN,BSN,CCM: extensive bladder and urinary diversion surgery, post op transferred to icu due to invasive hemodynamic monitoring. will follow for needs. next review due  [id number] or when transferred out of unit. POD-1

## 2013-03-26 NOTE — Op Note (Signed)
NAME:  Gregory Santana, Gregory Santana NO.:  [id number]  MEDICAL RECORD NO.:  [id number]  LOCATION:  [id number]                         FACILITY:  [facility]  PHYSICIAN:  Alexis Frock, MD     DATE OF BIRTH:  [birth year]  DATE OF PROCEDURE: 03/25/2013 DATE OF DISCHARGE:                              OPERATIVE REPORT   DIAGNOSES:  Massive volume high-grade bladder cancer with bilateral hydronephrosis, renal insufficiency and recurrent hematuria.  PROCEDURES: 1. Robotic-assisted laparoscopic cystoprostatectomy with bilateral     pelvic lymphadenectomy. 2. Ileal conduit urinary diversion. 3. Extensive adhesiolysis, laparoscopic.  ESTIMATED BLOOD LOSS:  200 mL.  COMPLICATIONS:  None.  SPECIMENS: 1. Right distal ureteral margin, negative for carcinoma on frozen     section. 2. Left distal ureteral margin, negative for carcinoma on frozen     section. 3. Bilateral final distal ureteral margins. 4. Right obturator group lymph nodes. 5. Right external iliac lymph nodes. 6. Left external iliac lymph nodes. 7. Left obturator group lymph nodes. 8. Cystoprostatectomy.  FINDINGS: 1. Multifocal pelvic abdominal adhesions especially in the left lower     quadrant and right lower quadrant, query prior appendicitis. Approximately 20 minutes were spent on laparosopic adhesiolysis alone to gain proper access to the pelvs to perform extirpative portions of procedure. 2. Severe hydroureteronephrosis, left greater than right.  DRAINS: 1. Jackson-Pratt drain to bulb suction. 2. Right lower quadrant ileal conduit to urostomy drainage with Bander     stents in situ, right is red, left is blue. 3. On-Q pain pump to fascial drainage.  INDICATIONS:  Gregory Santana is a 73 year old gentleman with long smoking history as well as comorbidities including cardiovascular disease.  He was found on workup of a very large volume hematuria to have a massive volume unresectable endoscopically of high-grade bladder  cancer.  He also had bilateral hydronephrosis with renal insufficiency.  He underwent staging transurethral resection of bladder tumor, which revealed the massive high-grade disease, which was unable to be completely resected endoscopically.  He had unilateral stent placed at that time as his ureteral orifice was contralaterally obliterated. The hydronephrosis was worrisome for locally-advanced disease.  Staging imaging, chest x-ray and CT revealed no obvious metastatic disease. Options were discussed including chemoradiation versus palliative therapies versus definitive management with cystoprostatectomy with and without minimally invasive assistance and wished to proceed with the latter.  He has had cardiovascular clearance and remains on beta-blocker and aspirin per his Cardiology's recommendations.  Informed consent was obtained and placed in the medical record.  PROCEDURE IN DETAIL:  The patient being Gregory Santana, was verified. Procedure being robotic-assisted cystoprostatectomy with ileal conduit urinary diversion was confirmed.  Procedure was carried out.  Time-out was performed.  Intravenous antibiotics were administered.  General endotracheal anesthesia was reduced.  Central line was placed by the anesthesia personnel as was the arterial line.  The patient has further fashioned on the operative table using 3-inch tape over foam padding.  A test of steep Trendelenburg position was performed.  He was found to be suitably positioned.  Sterile field was created by prepping and draping the patient's penis, perineum, and proximal thighs using iodine x3 and his infra-xiphoid abdomen using chlorhexidine gluconate.  Foley catheter was placed for easier to straight drain.  Next, a high-flow low-pressure pneumoperitoneum was obtained using Veress technique in the supraumbilical midline having passed the aspiration and drop test. Next, a 12-mm robotic camera port was placed in the same  location. Laparoscopic examination of the peritoneal cavity revealed multifocal thin, but numerous abdominal adhesions mostly of the descending colon and the area of the cecum.  There was no obvious metastatic disease. Additional ports placed as follows, right far lateral 8-mm robotic port, right paramedian 8-mm robotic port, right paramedian 15-mm assistant port at the previous site marked by the ileal conduit urinary diversion, left paramedian 8-mm robotic port and the left far lateral 12-mm robotic port.  Robot was docked and passed through electronic checks.  Initial attention was directed at the adhesiolysis.  Extensive adhesiolysis was performed both in the right lower quadrant and left lower quadrant excising a thin adhesions away from both the ileocecal area as well as the descending colon.  Further mobilization lateral to the colon was performed on the left side, exposing the area of the left iliac vessels. This adhesiolysis took approximately 25 minutes.  This dissection proceeded superiorly towards the area of the aortic bifurcation.  The left ureter was encountered and as expected was associated with severe hydroureteronephrosis and periureteral inflammation.  This very carefully, circumferentially mobilized to position approximately 4 cm above the iliac crossing and towards the area of the bladder hiatus as it was not clear whether the bladder would be resectable at this point, this was left in situ.  Lateral bladder attachments were carefully swept away from the pelvic sidewall towards the area of the endopelvic fascia.  A mirror image dissection was performed on the right side, thus freeing of the right lateral bladder and identifying the right ureter, which was associated with much less inflammatory reaction, and although, there was hydronephrosis that was not severe, the ureter was carefully and sequentially mobilized at the area of the bladder hiatus.  Following these  maneuvers, it did appear that the bladder was indeed resectable.  As such, the left distal ureter was ligated using extra large clip x2.  The proximal end being tagged of colored suture.  Contralateral ureter was similarly intact and ligated. Distal ends were sent for frozen section and found to be negative for carcinoma.  Next, posterior dissection was performed by incising between the two previous posterior peritoneal incisions and the posterior peritoneal flap directly behind the bladder and this was dissected towards the area of the apex of the prostate.  This exposed the vascular pedicles with the bladder bilaterally.  Next, endovascular stapler was used 3 loads each side sequentially in a base to apex orientation, providing excellent hemostatic control of the bladder pedicles as well as the prostate pedicles.  Endopelvic fascia was carefully swept away from the lateral aspects of the prostate.  Next, major dissection was performed by incising the medial umbilical ligaments bilaterally to the umbilicus and then sweeping the bladder away from the anterior abdomen, exposed the dorsovenous complex of the prostate, which was controlled using vascular load stapler.  Final apical attachments were taken down. Membranous urethra was transected.  This completely freed up the very large cystic prostate specimen, which was placed into an extra large EndoCatch bag for later retrieval.  Pelvis was very carefully inspected and no rectal violation was noted.  Digital rectal exam was performed using indicator glove, which again corroborated no evidence of rectal injury.  Next, the left ureter was retroperitonealized  by developing a plane just anterior to the aortic bifurcation and bringing the left ureter over to the right side that appeared to be adequate length for later ureteroenteric anastomosis.  Attention was then directed to pelvic lymphadenectomy.  First on the left side, all fiber fatty  tissue in the confines of left external iliac artery and vein were carefully mobilized.  There were several what appeared to be possibly pathologically enlarged lymph nodes in this location.  Lymphostasis was achieved with cold clips and was set aside labeled left external iliac lymph nodes.  All fiber fatty tissue in the confines of external iliac vein, obturator nerve, pelvic side wall, and internal iliac vessels were also carefully mobilized.  Lymphostasis was achieved with cold clips and was set aside labeled left obturator internal iliac lymph nodes, set aside for permanent pathology.  A mirror image lymphadenectomy was performed on the right side with identical boundaries.  The obturator nerves bilaterally were inspected following this maneuver and found to be uninjured and there appeared to be excellent hemostasis.  Next, the area of the ileocecal junction was identified robotically and traced superiorly for distance approximately 20 cm and tagged with absorbable suture.  This absorbable suture along with the distal ureteral areas were grasped laparoscopically.  Robot was undocked.  The patient was at level.  All ports except for the laparoscopic grasper port were removed. The previous camera port site was extended inferiorly for total distance approximately 10 cm, coursing around the left side of the umbilicus and the peritoneal cavity was entered.  The cystoprostatectomy system was removed and set aside for permanent pathology.  The Omni-Tract retractor was carefully deployed, was focused on excellent visualization of the ileocecal junction in previously tagged ureteral edges.  Notably, the patient had a previous right ureteral stent, this was purposely severed during previous ureteral ligation and the proximal end was inspected and discarded.  A segment of terminal ileum approximately 20 cm in length just proximal to the previously tagged area was identified and appeared to be  suitable for ileal conduit.  It was taken out of continuity using bowel load stapler followed by vascular load stapler of the mesentery x2 each side taking great care to avoid vascular compromise to the segment and this did not occur.  Segment was then placed lateral to the remainder of the internal ileum thus retroperitonealizing it and the bowel was brought back in the continuity using side-to-side staple anastomosis in the pelvis with stapler x2.  The free end was oversewn using running silk x2, first at approximating layers and second with imbricating layer.  Mesenteric defect was oversewn using interrupted silk as was the acute angle of the bowel anastomosis.  Anastomosis was palpably patent and visibly liable.  Conduit segment was placed into proper orientation and did appear this to be a suitable length as with the ureters for ureteroenteric anastomosis.  The distal bowel staple line was removed and first, the left ureter anastomosis was performed, a small segment of bowel serosa approximately 8 mm in diameter was excised near the butt end of the conduit as was the bowel serosa.  Four tacking sutures were placed at this level thus everting the bowel mucosa outside the serosa to allow circumferential visualization of this area.  A blue- colored Bander stent was placed across this and to level of a 24-cm to the ureteroenteric anastomosis.  The posterior heel stitch was applied of 4-0 Vicryl and two separate running suture lines of 4-0 Vicryl were used to  perform ureteroenteric anastomosis, which resulted in excellent wide open anastomosis that was visibly patent and watertight and viable. Similarly, the right ureteroenteric anastomosis was performed at the position approximately 2 cm more distal in the conduit in exact same fashion, this time using a red-colored Bander stent to 26 cm to the bowel anastomosis.  There was vigorous efflux of urine noted to the distal end of the Bander  stent.  The retractors were then taken down. All sponge and needle counts were correct.  Closed suction drain was brought through the previous right medial most robotic port sites at the level of peritoneal cavity.  The previous left lateral 12-mm assistant port site was closed with fascia using figure-of-eight Vicryl.  The skin overlying the previously marked conduit site was corresponded to a 12-mm port site was excised approximately 0.25-inch diameter and a column of fiber fatty tissue was dissected to the level of the fascia.  Four corners of the fascia were identified and tacking Vicryl applied to these edges.  Conduit segment was then brought through this location and again appeared to be viable and patent.  It was tacked to the fascia using the previously applied four sutures.  Next, four everting sutures were applied between the level of the skin and four corners of the distal ileal conduit, which provide excellent eversion of the edges, was again appeared to be pink and viable.  Intervening skin sutures of 3-0 Vicryl were placed x2 between each tacking suture.  The Bander stents were further anchored using through-and-through chromic stitch to each one via the middle aspect of the conduit x1 each loosely.  Peritoneal cavity was once again inspected.  There was appeared to be excellent hemostasis.  The bowel was inspected as for the bowel anastomosis.  All areas were appeared to be patent without undo rotation.  Omentum was brought over the area of the abdominal incision.  This area was reapproximated at the level of fascia using figure-of-eight PDS.  Next, a On-Q pain catheter pump was brought directly on top of the fascia and Scarpa was reapproximated using running Vicryl.  All incision sites were closed at the level of the skin using subcuticular Monocryl followed by Dermabond. Drain was placed on the suction.  The ostomy appliance was applied, and again appeared to be  excellent urine output from both Bander stents. Procedure was then terminated.  The patient tolerated the procedure well.  There were no immediate periprocedural complications.  The patient was taken to the postanesthesia care unit in stable condition.          ______________________________ Alexis Frock, MD     TM/MEDQ  D:  03/25/2013  T:  03/26/2013  Job:  [id number]

## 2013-03-27 ENCOUNTER — Ambulatory Visit: Payer: Medicare PPO | Admitting: Cardiovascular Disease

## 2013-03-27 ENCOUNTER — Encounter (HOSPITAL_COMMUNITY): Payer: Self-pay | Admitting: Urology

## 2013-03-27 LAB — BASIC METABOLIC PANEL
BUN: 11 mg/dL (ref 6–23)
CALCIUM: 8.4 mg/dL (ref 8.4–10.5)
CO2: 23 mEq/L (ref 19–32)
CREATININE: 1.7 mg/dL — AB (ref 0.50–1.35)
Chloride: 104 mEq/L (ref 96–112)
GFR calc non Af Amer: 39 mL/min — ABNORMAL LOW (ref >90)
GFR, EST AFRICAN AMERICAN: 45 mL/min — AB (ref >90)
Glucose, Bld: 110 mg/dL — ABNORMAL HIGH (ref 70–99)
Potassium: 3.7 mEq/L (ref 3.7–5.3)
Sodium: 137 mEq/L (ref 137–147)

## 2013-03-27 LAB — POCT I-STAT EG7
Acid-base deficit: 5 mmol/L — ABNORMAL HIGH (ref 0.0–2.0)
Bicarbonate: 22.5 mEq/L (ref 20.0–24.0)
Calcium, Ion: 1.18 mmol/L (ref 1.13–1.30)
HCT: 37 % — ABNORMAL LOW (ref 39.0–52.0)
Hemoglobin: 12.6 g/dL — ABNORMAL LOW (ref 13.0–17.0)
O2 SAT: 85 %
POTASSIUM: 3.7 meq/L (ref 3.7–5.3)
Patient temperature: 35.8
SODIUM: 142 meq/L (ref 137–147)
TCO2: 24 mmol/L (ref 0–100)
pCO2, Ven: 49.7 mmHg (ref 45.0–50.0)
pH, Ven: 7.258 (ref 7.250–7.300)
pO2, Ven: 54 mmHg — ABNORMAL HIGH (ref 30.0–45.0)

## 2013-03-27 LAB — HEMOGLOBIN AND HEMATOCRIT, BLOOD
HCT: 33.3 % — ABNORMAL LOW (ref 39.0–52.0)
Hemoglobin: 10.8 g/dL — ABNORMAL LOW (ref 13.0–17.0)

## 2013-03-27 NOTE — Progress Notes (Signed)
:    [id number] Rosana Hoes, RN, BSN, CCM, [phone] Chart reviewed for update of needs and condition. POD 1, ostomy good condition, urinary output in pouch, arterial line dcd,

## 2013-03-27 NOTE — Progress Notes (Addendum)
CSW received notification from pt son that pt daughter whom pt lives with wished to speak with CSW.  CSW received verbal consent from pt at bedside to contact pt daughter.  CSW contacted pt daughter, Gregory Santana via telephone Gregory Santana can be reached at [phone]). CSW discussed with pt daughter discussion that was had with pt and pt son at bedside earlier today. Pt daughter discussed that pt will not want to go to SNF upon discharge. Pt daughter discussed that she works, but pt daughter has arranged for her aunt to come and stay with pt for a few weeks once pt discharges home. Pt daughter is agreeable to home health services at home. CSW explained private duty care agencies as well. CSW provided home health agency and private duty care agency list to pt son at bedside.   CSW notified RNCM that pt and pt family would like for pt to return home with 24 hour care from family and home health services.  RNCM to follow up regarding home health needs.   No further social work needs identified at this time.   Pt and pt family appreciative of CSW support and assistance.  CSW signing off. Please re-consult if social work needs arise.  Gregory Santana, MSW, [place] Work [phone]

## 2013-03-27 NOTE — Progress Notes (Signed)
Clinical Social Work Department BRIEF PSYCHOSOCIAL ASSESSMENT 03/27/2013  Patient:  Gregory Santana, Gregory Santana     Account Number:  [id number]     Admit date:  03/24/2013  Clinical Social Worker:  Ulyess Blossom  Date/Time:  03/27/2013 11:30 AM  Referred by:  CSW  Date Referred:  03/27/2013 Referred for  SNF Placement   Other Referral:   Interview type:  Patient Other interview type:   and patient son at bedside    PSYCHOSOCIAL DATA Living Status:  FAMILY Admitted from facility:   Level of care:   Primary support name:  Geoff Ned/son/[phone] Primary support relationship to patient:  CHILD, ADULT Degree of support available:   adequate    CURRENT CONCERNS Current Concerns  Post-Acute Placement   Other Concerns:    SOCIAL WORK ASSESSMENT / PLAN CSW reviewed chart and noted that PT recommending SNF if pt does not have 24 hour care at home.    CSW met with pt and pt son at bedside. CSW introduced self and explained role. Pt reports that currently pt daughter, Erline Levine lives with pt. Pt states that he has 24 hour care at home, but pt son shakes head "no" when pt is stating that pt daughter is there 24 hours a day. CSW discussed that from PT evaluation recommendation is for pt to have 24 hour care at home or rehab at SNF if 24 hour care not available. Pt very hesitant about SNF placement and pt son discussed that pt will ideally want to return home. Pt and pt son would like to see how pt progresses with therapy in order to make further decisions about disposition plan.    Pt and pt son agreeable to CSW to continuing to follow and folllowing up with pt regarding plan. CSW encouraged pt and pt son to discuss recommendations with pt daughter as pt daughter lives with pt. Pt and pt son expressed understanding.    CSW to follow up with pt and pt family regarding dispositon plan and continue to assess if pt agreeable to initiating SNF search. Pt not agreeable to exploring option at this  time.    CSW to continue to follow.   Assessment/plan status:  Psychosocial Support/Ongoing Assessment of Needs Other assessment/ plan:   discharge planning   Information/referral to community resources:   pt and pt family declining exploring SNF option at this time, CSW to continue to assess and provide appropriate community resources as appropriate    PATIENT'S/FAMILY'S RESPONSE TO PLAN OF CARE: Pt alert and oriented x 4. Pt appears to have strong family support. Pt demonstrated hesitancy about SNF as a plan for disposition and would prefer to see how he progresses to further determine plan. Pt son felt the same about wanting to see how pt progresses.    Alison Murray, MSW, [place] Work [phone]

## 2013-03-28 LAB — BASIC METABOLIC PANEL
BUN: 11 mg/dL (ref 6–23)
CHLORIDE: 106 meq/L (ref 96–112)
CO2: 22 mEq/L (ref 19–32)
Calcium: 8.5 mg/dL (ref 8.4–10.5)
Creatinine, Ser: 1.46 mg/dL — ABNORMAL HIGH (ref 0.50–1.35)
GFR calc Af Amer: 54 mL/min — ABNORMAL LOW (ref >90)
GFR calc non Af Amer: 47 mL/min — ABNORMAL LOW (ref >90)
Glucose, Bld: 103 mg/dL — ABNORMAL HIGH (ref 70–99)
POTASSIUM: 3.8 meq/L (ref 3.7–5.3)
Sodium: 139 mEq/L (ref 137–147)

## 2013-03-28 LAB — TYPE AND SCREEN
ABO/RH(D): B POS
Antibody Screen: NEGATIVE
Unit division: 0
Unit division: 0

## 2013-03-28 LAB — HEMOGLOBIN AND HEMATOCRIT, BLOOD
HEMATOCRIT: 34.4 % — AB (ref 39.0–52.0)
Hemoglobin: 11.3 g/dL — ABNORMAL LOW (ref 13.0–17.0)

## 2013-03-28 MED ORDER — SODIUM CHLORIDE 0.9 % IJ SOLN
10.0000 mL | INTRAMUSCULAR | Status: DC | PRN
  Administered 2013-03-28 – 2013-03-29 (×2): 10 mL
  Administered 2013-03-30 (×2): 20 mL

## 2013-03-28 MED ORDER — BIOTENE DRY MOUTH MT LIQD: 15.0000 mL | Freq: Two times a day (BID) | OROMUCOSAL | Status: DC

## 2013-03-28 NOTE — Progress Notes (Signed)
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    Transferred to floor yesterday. No acute events overnight. Pain controlled w/ PCA. Negative flatus/BM. Negative nausea or emesis. Tolerating ice chips. Ambulating w/ assist.  Objective: Vital signs in last 24 hours: Temp:  [97.7 F (36.5 C)-99 F (37.2 C)] 97.9 F (36.6 C) (02/28 0530) Pulse Rate:  [88-96] 88 (02/28 0530) Resp:  [15-25] 18 (02/28 0800) BP: (121-145)/(62-83) 123/83 mmHg (02/28 0530) SpO2:  [92 %-98 %] 94 % (02/28 0800) Weight:  [113.807 kg (250 lb 14.4 oz)] 113.807 kg (250 lb 14.4 oz) (02/27 1657) Last BM Date: 03/24/13  Intake/Output from previous day: 02/27 0701 - 02/28 0700 In: 2125 [I.V.:2125] Out: 1292 [Urine:925; Drains:367] Intake/Output this shift:    General appearance: alert, cooperative and appears stated age Head: Normocephalic, without obvious abnormality, atraumatic Eyes: negative Nose: Nares normal. Septum midline. Mucosa normal. No drainage or sinus tenderness. Throat: normal findings: lips normal without lesions Back: symmetric, no curvature. ROM normal. No CVA tenderness. Resp: mild wheezes throughout, no dyspnea. Did spirometery at bedside with instruction. Cardio: mod tachy, regular rythem GI: scan bowel sounds, no distention. RLQ Urostomy pink / patent wtih bander stents in place. Wound c/d/i wiht pain catheter in place. JP with minimal serosanguinous fluid. Male genitalia: normal, no penile drainage / discharge Extremities: extremities normal, atraumatic, no cyanosis or edema and Rt radial A-line in place. RUE warm and perfused distallyi. Pulses: 2+ and symmetric Skin: Skin color, texture, turgor normal. No rashes or lesions Lymph nodes: Cervical,  supraclavicular, and axillary nodes normal. Rt EJ triple lumen c/d/i, no skin site erythema.  Lab Results:   Recent Labs  03/27/13 0400 03/28/13 0547  HGB 10.8* 11.3*  HCT 33.3* 34.4*   BMET  Recent Labs  03/27/13 0400 03/28/13 0547  NA 137 139  K 3.7 3.8  CL 104 106  CO2 23 22  GLUCOSE 110* 103*  BUN 11 11  CREATININE 1.70* 1.46*  CALCIUM 8.4 8.5   PT/INR No results found for this basename: LABPROT, INR,  in the last 72 hours ABG  Recent Labs  03/25/13 1507  HCO3 22.5    Studies/Results: No results found.  Anti-infectives: Anti-infectives   Start     Dose/Rate Route Frequency Ordered Stop   03/25/13 2000  piperacillin-tazobactam (ZOSYN) IVPB 3.375 g     3.375 g 12.5 mL/hr over 240 Minutes Intravenous Every 8 hours 03/25/13 1912 03/28/13 1959   03/25/13 0830  piperacillin-tazobactam (ZOSYN) IVPB 3.375 g     3.375 g 100 mL/hr over 30 Minutes Intravenous  Once 03/25/13 0817 03/25/13 1403   03/24/13 1500  cefTRIAXone (ROCEPHIN) 1 g in dextrose 5 % 50 mL IVPB     1 g 100 mL/hr over 30 Minutes Intravenous  Once 03/24/13 1451 03/24/13 1630      Assessment/Plan:  1 - Large Volume High-Grade Bladder Cancer - Doing well s/p cystectomy. -Clear liquid diet. -Continue IS and ambulation. -Supportive care. -Await bowel function.  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

## 2013-03-28 NOTE — Progress Notes (Signed)
Pt ambulating with nurse tech to bathroom and On-Q pump tubing came out of incision.  RN inspected pump and was empty so discarded it. Andre Lefort

## 2013-03-29 LAB — BASIC METABOLIC PANEL
BUN: 10 mg/dL (ref 6–23)
CHLORIDE: 102 meq/L (ref 96–112)
CO2: 23 mEq/L (ref 19–32)
Calcium: 8.4 mg/dL (ref 8.4–10.5)
Creatinine, Ser: 1.3 mg/dL (ref 0.50–1.35)
GFR, EST AFRICAN AMERICAN: 63 mL/min — AB (ref >90)
GFR, EST NON AFRICAN AMERICAN: 54 mL/min — AB (ref >90)
Glucose, Bld: 119 mg/dL — ABNORMAL HIGH (ref 70–99)
POTASSIUM: 3.4 meq/L — AB (ref 3.7–5.3)
Sodium: 137 mEq/L (ref 137–147)

## 2013-03-29 LAB — HEMOGLOBIN AND HEMATOCRIT, BLOOD
HEMATOCRIT: 33.7 % — AB (ref 39.0–52.0)
HEMOGLOBIN: 11.2 g/dL — AB (ref 13.0–17.0)

## 2013-03-29 MED ORDER — POTASSIUM CHLORIDE 10 MEQ/100ML IV SOLN: 10.0000 meq | INTRAVENOUS | Status: DC

## 2013-03-29 MED ORDER — POTASSIUM CHLORIDE 10 MEQ/100ML IV SOLN
10.0000 meq | INTRAVENOUS | Status: AC
  Administered 2013-03-29 (×3): 10 meq via INTRAVENOUS
  Filled 2013-03-29 (×3): qty 100

## 2013-03-29 MED ORDER — FENTANYL CITRATE 0.05 MG/ML IJ SOLN: 25.0000 ug | INTRAMUSCULAR | Status: DC | PRN

## 2013-03-29 MED ORDER — OXYCODONE-ACETAMINOPHEN 5-325 MG PO TABS
1.0000 | ORAL_TABLET | ORAL | Status: DC | PRN
  Administered 2013-03-29: 1 via ORAL
  Filled 2013-03-29: qty 1

## 2013-03-29 NOTE — Progress Notes (Signed)
4 Days Post-Op  Subjective:  1 - Large Volume High-Grade Bladder Cancer - s/p robotic cystoprostatectomy with ileal conduit urinary diversion and extensive adhesiolysis 03/25/13.   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    Had some SOB and mild tachycardia early this morning which resolved w/ replacing his oxygen per NG. Denies SOB or chest pain at this time. EKG at the time showed sinus tachycardia with stable 1st degree AV block.   He started passing flatus yesterday and continues today. Positive BM this morning. Negative nausea. Tolerated clears.    Objective: Vital signs in last 24 hours: Temp:  [97.5 F (36.4 C)-98.3 F (36.8 C)] 97.5 F (36.4 C) (03/01 0609) Pulse Rate:  [66-103] 93 (03/01 0609) Resp:  [14-28] 14 (03/01 0609) BP: (109-157)/(58-86) 138/77 mmHg (03/01 0609) SpO2:  [94 %-98 %] 96 % (03/01 0609) Last BM Date:  (pre op)  Intake/Output from previous day: 02/28 0701 - 03/01 0700 In: 1630 [I.V.:1200; IV Piggyback:200] Out: 1480 [Urine:1300; Drains:180] Intake/Output this shift: Total I/O In: 110 [Other:110] Out: 930 [Urine:750; Drains:180]  General appearance: alert, cooperative and appears stated age Head: Normocephalic, without obvious abnormality, atraumatic Eyes: negative Nose: Nares normal. Septum midline. Mucosa normal. No drainage or sinus tenderness. Throat: normal findings: lips normal without lesions Back: symmetric, no curvature. ROM normal. No CVA tenderness. Resp: mild wheezes throughout, no dyspnea. Did spirometery at bedside with instruction. Cardio: mod tachy, regular rythem GI: scan bowel sounds, no distention. RLQ Urostomy pink / patent wtih bander stents in place. Wound c/d/i wiht pain catheter in place. JP with minimal serosanguinous fluid. Male genitalia: normal, no penile drainage / discharge Extremities: extremities normal, atraumatic, no cyanosis or edema and Rt radial  A-line in place. RUE warm and perfused distallyi. Pulses: 2+ and symmetric Skin: Skin color, texture, turgor normal. No rashes or lesions Lymph nodes: Cervical, supraclavicular, and axillary nodes normal. Rt EJ triple lumen c/d/i, no skin site erythema.  Lab Results:   Recent Labs  03/28/13 0547 03/29/13 0438  HGB 11.3* 11.2*  HCT 34.4* 33.7*   BMET  Recent Labs  03/28/13 0547 03/29/13 0438  NA 139 137  K 3.8 3.4*  CL 106 102  CO2 22 23  GLUCOSE 103* 119*  BUN 11 10  CREATININE 1.46* 1.30  CALCIUM 8.5 8.4   PT/INR No results found for this basename: LABPROT, INR,  in the last 72 hours ABG No results found for this basename: PHART, PCO2, PO2, HCO3,  in the last 72 hours  Studies/Results: No results found.  Anti-infectives: Anti-infectives   Start     Dose/Rate Route Frequency Ordered Stop   03/25/13 2000  piperacillin-tazobactam (ZOSYN) IVPB 3.375 g     3.375 g 12.5 mL/hr over 240 Minutes Intravenous Every 8 hours 03/25/13 1912 03/28/13 1626   03/25/13 0830  piperacillin-tazobactam (ZOSYN) IVPB 3.375 g     3.375 g 100 mL/hr over 30 Minutes Intravenous  Once 03/25/13 0817 03/25/13 1403   03/24/13 1500  cefTRIAXone (ROCEPHIN) 1 g in dextrose 5 % 50 mL IVPB     1 g 100 mL/hr over 30 Minutes Intravenous  Once 03/24/13 1451 03/24/13 1630      Assessment/Plan:  1 - Large Volume High-Grade Bladder Cancer - Doing well s/p cystectomy. -Regular diet. -Continue IS and ambulation. -Continue PCA until we know he can tolerate regular food.  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 -Hypokalemia today  is mild. Continue K in IV fluids; give additional 30 mEq IV today.      Molli Hazard 03/29/2013

## 2013-03-29 NOTE — Progress Notes (Signed)
Pt c/o shortness of breath and  chest pain to mid chest. Asked RN if he could sit up in bed. Oxygen therapy increased from 1 L to 4L. EKG done Sinus Tachycardia with first degree heart block with frequent PVCs. VS 157/86, 103, 97.6, 95% 2L, 28 respirations.  Metoprolol 5mg  IV given as scheduled. Rechecked BP 20 min after Metoprolol given 139/74, 87. Dr. Jasmine December notified.  Will continue to monitor and notify MD of any further changes.  Wendee Beavers [place]

## 2013-03-30 LAB — HEMOGLOBIN AND HEMATOCRIT, BLOOD
HCT: 33.3 % — ABNORMAL LOW (ref 39.0–52.0)
Hemoglobin: 11.2 g/dL — ABNORMAL LOW (ref 13.0–17.0)

## 2013-03-30 LAB — BASIC METABOLIC PANEL
BUN: 11 mg/dL (ref 6–23)
CALCIUM: 8.7 mg/dL (ref 8.4–10.5)
CO2: 21 mEq/L (ref 19–32)
Chloride: 104 mEq/L (ref 96–112)
Creatinine, Ser: 1.32 mg/dL (ref 0.50–1.35)
GFR, EST AFRICAN AMERICAN: 61 mL/min — AB (ref >90)
GFR, EST NON AFRICAN AMERICAN: 53 mL/min — AB (ref >90)
GLUCOSE: 104 mg/dL — AB (ref 70–99)
POTASSIUM: 3.5 meq/L — AB (ref 3.7–5.3)
Sodium: 137 mEq/L (ref 137–147)

## 2013-03-30 LAB — CREATININE, FLUID (PLEURAL, PERITONEAL, JP DRAINAGE): Creat, Fluid: 1.4 mg/dL

## 2013-03-30 NOTE — Progress Notes (Signed)
Pharmacy Brief Note - Alvimopan (Entereg)  The standing order set for alvimopan (Entereg) now includes an automatic order to discontinue the drug after the patient has had a bowel movement.  The change was approved by the [place] and the Medical Executive Committee.    This patient has had a bowel movement documented by nursing.  Therefore, alvimopan has been discontinued.  If there are questions, please contact the pharmacy at [phone].  Thank you-  Aleighna Wojtas, Gaye Alken PharmD Pager #: [phone] 11:35 AM 03/30/2013

## 2013-03-30 NOTE — Progress Notes (Signed)
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 Has practiced steps. Pt hopeful to DC tomorrow. Recommend HHPT  Follow Up Recommendations  Supervision/Assistance - 24 hour     Does the patient have the potential to tolerate intense rehabilitation     Barriers to Discharge        Equipment Recommendations  None recommended by PT    Recommendations for Other Services    Frequency Min 3X/week   Progress towards PT Goals Progress towards PT goals: Progressing toward goals  Plan Discharge plan needs to be updated    Precautions / Restrictions Precautions Precautions: Fall Precaution Comments: urostoomy. JP drain Restrictions Weight Bearing Restrictions: No   Pertinent Vitals/Pain Rates pain mild.    Mobility  Transfers Overall transfer level: Needs assistance Equipment used:  (IV pole, rail) Transfers: Sit to/from Stand Sit to Stand: Supervision General transfer comment: cues for hand palcement,   Ambulation/Gait Ambulation/Gait assistance: Min guard Ambulation Distance (Feet): 200 Feet Assistive device:  (iv pole) Gait Pattern/deviations: Step-through pattern Gait velocity: decreased,  General Gait Details: generally is steady, at times bumps L side on door, rail but does not lose balance. 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 for tasks assessed/performed Overall Cognitive Status: Within Functional Limits for tasks assessed    Balance  Balance Overall balance assessment: Needs assistance Standing balance support: No upper extremity supported;During functional activity Standing balance-[name]: Fair  End of Session PT - End of Session Equipment Utilized During Treatment: Gait belt Activity Tolerance: Patient tolerated treatment well Patient left: in chair;with call bell/phone within reach;with family/visitor present Nurse Communication: Mobility status   GP     Gregory Santana 03/30/2013, 2:26 [id number]

## 2013-03-31 MED ORDER — OXYCODONE-ACETAMINOPHEN 5-325 MG PO TABS: 1.0000 | ORAL_TABLET | ORAL | Status: DC | PRN

## 2013-03-31 MED ORDER — CALCIUM CARBONATE ANTACID 500 MG PO CHEW
1.0000 | CHEWABLE_TABLET | Freq: Once | ORAL | Status: AC
  Administered 2013-03-31: 200 mg via ORAL
  Filled 2013-03-31: qty 1

## 2013-03-31 MED ORDER — SENNOSIDES-DOCUSATE SODIUM 8.6-50 MG PO TABS: 1.0000 | ORAL_TABLET | Freq: Two times a day (BID) | ORAL | Status: DC

## 2013-03-31 NOTE — Progress Notes (Signed)
Pt's wife at bedside selected [facility]. Referral given to in house rep.

## 2013-03-31 NOTE — Consult Note (Signed)
WOC ostomy follow up Stoma type/location: RLQ ilealconduit Stomal assessment/size: 1 and 3/8 inches round, edematous, os at center, 2 stents in place.  Red stent is for right ureter, blue stent is for left. Peristomal assessment: intact, clear Treatment options for stomal/peristomal skin: None indicated Output dark yellow urine Ostomy pouching: 1pc.convex urostomy pouch. Education provided: Demonstration to patient and daughter on pouch preparation, pouch application and removal.  Patient is independent in emptying.  Instruction sheet provided for 1-piece pouch application.  Discussed with daughter care of nighttime drainage bag (water rinse qam, water and vinegar rinse weekly).  Indicates understanding.  Daughter demonstrates application of nighttime drainage bag (via adapter) to spout on pouch and removal with no difficulty. Enrolled patient in [place] Discharge program: Yes Ready for discharge to the care of his daughter, sister, [facility] and with follow up as recommended by Dr. Tresa Moore. Thanks, Maudie Flakes, MSN, RN, [place], [place], [place] [phone])

## 2013-03-31 NOTE — Progress Notes (Signed)
PAtient discharged home with daughter, discharge instructions given and explained to patient/daughter and they verbalized understanding. Patient denies any pain/distress. Surgical incision intact, with no sign of infection. No other wound noted. Accompanied home by daughter, transported to the car by staff.

## 2013-03-31 NOTE — Discharge Summary (Signed)
Physician Discharge Summary  Patient ID: Gregory Santana MRN: [id number] DOB/AGE: [birth year] 73 y.o.  Admit date: 03/24/2013 Discharge date: 03/31/2013  Admission Diagnoses:  Discharge Diagnoses:  Active Problems:   Bladder cancer   Discharged Condition: good  Hospital Course:   1 - Large Volume High-Grade Bladder Cancer - s/p robotic cystoprostatectomy with ileal conduit urinary diversion and extensive adhesiolysis 03/25/13. Stepdown initially post-op, but transferred to floor POD 2. Kept on ice chips until POD 3 at which poing advanced to clears and began having resumed bowel function. PT eval rec home wit HH PT , but no equipment. Wound ostomy nurses reinforced new ostomy teaching and will be arranged at discharge.  By POD 6, the day of discharge, pt ambulatory, pain controlled on PO meds, Hgb stable, no wound problems, resumed regular diet, and felt to be adequate for discharge. Final path pending at discharge. JP and Central line removed before discharge.   Consults: PT, wound ostomy  Significant Diagnostic Studies: labs: pathology - pending at discharge  Treatments: surgery: robotic cystoprostatectomy with ileal conduit urinary diversion and extensive adhesiolysis 03/25/13  Discharge Exam: Blood pressure 123/70, pulse 71, temperature 98.1 F (36.7 C), temperature source Oral, resp. rate 14, height 6\' 1"  (1.854 m), weight 113.807 kg (250 lb 14.4 oz), SpO2 98.00%. General appearance: alert, cooperative and appears stated age Head: Normocephalic, without obvious abnormality, atraumatic Eyes: negative Ears: no gross abnormalities Nose: Nares normal. Septum midline. Mucosa normal. No drainage or sinus tenderness. Throat: lips, mucosa, and tongue normal; teeth and gums normal Back: symmetric, no curvature. ROM normal. No CVA tenderness. Resp: no wheezes of labored breathing Chest wall: no tenderness Cardio: regular rate and rhythm, S1, S2 normal, no murmur, click, rub or  gallop GI: soft, non-tender; bowel sounds normal; no masses,  no organomegaly Male genitalia: normal Extremities: extremities normal, atraumatic, no cyanosis or edema Pulses: 2+ and symmetric Skin: Skin color, texture, turgor normal. No rashes or lesions Lymph nodes: Cervical, supraclavicular, and axillary nodes normal. Neurologic: Grossly normal Incision/Wound: Recent incision sites c/d/i. JP drain removed. Rt EJ central line removed and pressure held for 70min. RLQ Urostomy pink / patent with clear urin in badg and bander stents i nplace.  Disposition: 01-Home or Self Care   Future Appointments Provider Department Dept Phone   06/25/2013 9:00 AM Herminio Commons, MD [place] [phone]       Medication List         atorvastatin 20 MG tablet  Commonly known as:  LIPITOR  Take 20 mg by mouth daily.     LORazepam 0.5 MG tablet  Commonly known as:  ATIVAN  Take 0.5 mg by mouth at bedtime as needed for sleep.     metoprolol succinate 25 MG 24 hr tablet  Commonly known as:  TOPROL-XL  Take 25 mg by mouth every morning.     oxyCODONE-acetaminophen 5-325 MG per tablet  Commonly known as:  PERCOCET/ROXICET  Take 1-2 tablets by mouth every 4 (four) hours as needed for moderate pain or severe pain. Post-operatively     senna-docusate 8.6-50 MG per tablet  Commonly known as:  Senokot-S  Take 1 tablet by mouth 2 (two) times daily. While taking apin meds to prevent constipation           Follow-up Information   Follow up with Alexis Frock, MD. (We will contact you for appt in abotu 2 weeks)    Specialty:  Urology   Contact information:   [address]  [facility]  [place] [address] [phone]       Signed: Alexis Frock 03/31/2013, 8:09 AM

## 2013-04-01 ENCOUNTER — Telehealth: Payer: Self-pay | Admitting: Cardiovascular Disease

## 2013-04-01 NOTE — Telephone Encounter (Signed)
Spoke to pt's daughter. She states that he has not taking Lisinopril since surgery. Told pt's daughter not to take anymore. Made an appointment to see Dr Bronson Ing on 04/20/13

## 2013-04-01 NOTE — Telephone Encounter (Signed)
I reviewed the records. It is difficult to determine whether he was actually taking lisinopril at the time he saw Dr. Bronson Ing in February - the medicine list indicates that it was being held, however the impression section indicates that he was to continue lisinopril. It is clear that the plan was for him to take aspirin and Toprol-XL. If he was actually not on lisinopril and has not been taking it recently, would not start it anew now. Please schedule office visit with Dr. Bronson Ing or NP to review medications and clinical status, then determine if adjustments need to be made to further optimiz the treatment of cardiomyopathy.

## 2013-04-01 NOTE — Telephone Encounter (Signed)
Patient's daughter has questions about Metoprolol. Does he still need to take Lisinopril as well. / tgs

## 2013-04-01 NOTE — Telephone Encounter (Signed)
Per recent d/c summary Lisinopril was not on the list. Was to be on Metoprolol peri-operatively. Please advise due to Dr Bronson Ing absence.

## 2013-04-03 SURGERY — Surgical Case
Anesthesia: *Unknown

## 2013-04-08 NOTE — Op Note (Signed)
NAME: SA, PISARSKI NO.: [id number]   MEDICAL RECORD NO.: [id number]   LOCATION: [id number] FACILITY: [facility]   PHYSICIAN: Alexis Frock, MD DATE OF BIRTH: 1940-08-31   DATE OF PROCEDURE: 03/25/2013  DATE OF DISCHARGE:   OPERATIVE REPORT   DIAGNOSES: Massive volume high-grade bladder cancer with bilateral  hydronephrosis, renal insufficiency and recurrent hematuria.   PROCEDURES:  1. Robotic-assisted laparoscopic cystoprostatectomy with bilateral  pelvic lymphadenectomy.  2. Ileal conduit urinary diversion.  3. Extensive adhesiolysis, laparoscopic.   ESTIMATED BLOOD LOSS: 200 mL.   COMPLICATIONS: None.   ASSISTANT: Felipa Furnace, PA  SPECIMENS:  1. Right distal ureteral margin, negative for carcinoma on frozen  section.  2. Left distal ureteral margin, negative for carcinoma on frozen  section.  3. Bilateral final distal ureteral margins.  4. Right obturator group lymph nodes.  5. Right external iliac lymph nodes.  6. Left external iliac lymph nodes.  7. Left obturator group lymph nodes.  8. Cystoprostatectomy.   FINDINGS:  1. Multifocal pelvic abdominal adhesions especially in the left lower  quadrant and right lower quadrant, query prior appendicitis. Approximately 20 minutes were spent on laparosopic adhesiolysis alone to gain proper access to the pelvs to perform extirpative portions of procedure.  2. Severe hydroureteronephrosis, left greater than right.   DRAINS:  1. Jackson-Pratt drain to bulb suction.  2. Right lower quadrant ileal conduit to urostomy drainage with Bander  stents in situ, right is red, left is blue.  3. On-Q pain pump to fascial drainage.   INDICATIONS: Mr. Fithen is a 73 year old gentleman with long smoking  history as well as comorbidities including cardiovascular disease. He  was found on workup of a very large volume hematuria to have a massive  volume unresectable endoscopically of high-grade bladder cancer. He  also had  bilateral hydronephrosis with renal insufficiency. He  underwent staging transurethral resection of bladder tumor, which  revealed the massive high-grade disease, which was unable to be  completely resected endoscopically. He had unilateral stent placed  at that time as his ureteral orifice was contralaterally obliterated.  The hydronephrosis was worrisome for locally-advanced disease. Staging  imaging, chest x-ray and CT revealed no obvious metastatic disease.  Options were discussed including chemoradiation versus palliative  therapies versus definitive management with cystoprostatectomy with and  without minimally invasive assistance and wished to proceed with the  latter. He has had cardiovascular clearance and remains on beta-blocker  and aspirin per his Cardiology's recommendations. Informed consent was  obtained and placed in the medical record.   PROCEDURE IN DETAIL: The patient being Gregory Santana, was verified.  Procedure being robotic-assisted cystoprostatectomy with ileal conduit  urinary diversion was confirmed. Procedure was carried out. Time-out  was performed. Intravenous antibiotics were administered. General  endotracheal anesthesia was reduced. Central line was placed by the  anesthesia personnel as was the arterial line. The patient has further  fashioned on the operative table using 3-inch tape over foam padding. A  test of steep Trendelenburg position was performed. He was found to be  suitably positioned. Sterile field was created by prepping and draping  the patient's penis, perineum, and proximal thighs using iodine x3 and  his infra-xiphoid abdomen using chlorhexidine gluconate. Foley catheter  was placed for easier to straight drain. Next, a high-flow low-pressure  pneumoperitoneum was obtained using Veress technique in the  supraumbilical midline having passed the aspiration and drop test.  Next, a 12-mm robotic camera port was placed in the same location.   Laparoscopic examination of  the peritoneal cavity revealed multifocal  thin, but numerous abdominal adhesions mostly of the descending colon  and the area of the cecum. There was no obvious metastatic disease.  Additional ports placed as follows, right far lateral 8-mm robotic port,  right paramedian 8-mm robotic port, right paramedian 15-mm assistant  port at the previous site marked by the ileal conduit urinary diversion,  left paramedian 8-mm robotic port and the left far lateral 12-mm robotic  port.   Robot was docked and passed through electronic checks. Initial  attention was directed at the adhesiolysis. Extensive adhesiolysis was  performed both in the right lower quadrant and left lower quadrant  excising a thin adhesions away from both the ileocecal area as well as  the descending colon. Further mobilization lateral to the colon was  performed on the left side, exposing the area of the left iliac vessels. This adhesiolysis took approximately 25 minutes.   This dissection proceeded superiorly towards the area of the aortic  bifurcation. The left ureter was encountered and as expected was  associated with severe hydroureteronephrosis and periureteral  inflammation. This very carefully, circumferentially mobilized to  position approximately 4 cm above the iliac crossing and towards the  area of the bladder hiatus as it was not clear whether the bladder would  be resectable at this point, this was left in situ. Lateral bladder  attachments were carefully swept away from the pelvic sidewall towards  the area of the endopelvic fascia. A mirror image dissection was  performed on the right side, thus freeing of the right lateral bladder  and identifying the right ureter, which was associated with much less  inflammatory reaction, and although, there was hydronephrosis that was  not severe, the ureter was carefully and sequentially mobilized at the  area of the bladder hiatus.  Following these maneuvers, it did appear  that the bladder was indeed resectable. As such, the left distal ureter  was ligated using extra large clip x2. The proximal end being tagged of  colored suture. Contralateral ureter was similarly intact and ligated.  Distal ends were sent for frozen section and found to be negative for  carcinoma. Next, posterior dissection was performed by incising between  the two previous posterior peritoneal incisions and the posterior  peritoneal flap directly behind the bladder and this was dissected  towards the area of the apex of the prostate. This exposed the vascular  pedicles with the bladder bilaterally. Next, endovascular stapler was  used 3 loads each side sequentially in a base to apex orientation,  providing excellent hemostatic control of the bladder pedicles as well  as the prostate pedicles. Endopelvic fascia was carefully swept away  from the lateral aspects of the prostate. Next, anterior dissection was  performed by incising the medial umbilical ligaments bilaterally to the  umbilicus and then sweeping the bladder away from the anterior abdomen,  exposed the dorsovenous complex of the prostate, which was controlled  using vascular load stapler. Final apical attachments were taken down.  Membranous urethra was transected. This completely freed up the very  large cystic prostate specimen, which was placed into an extra large  EndoCatch bag for later retrieval. Pelvis was very carefully inspected  and no rectal violation was noted. Digital rectal exam was performed  using indicator glove, which again corroborated no evidence of rectal  injury. Next, the left ureter was retroperitonealized by developing a  plane just anterior to the aortic bifurcation and bringing the left  ureter over to  the right side that appeared to be adequate length for  later ureteroenteric anastomosis.  Attention was then directed to pelvic  lymphadenectomy. First on  the left side, all fiber fatty tissue in the  confines of left external iliac artery and vein were carefully  mobilized. There were several what appeared to be possibly  pathologically enlarged lymph nodes in this location. Lymphostasis was  achieved with cold clips and was set aside labeled left external iliac  lymph nodes. All fiber fatty tissue in the confines of external iliac  vein, obturator nerve, pelvic side wall, and internal iliac vessels were  also carefully mobilized. Lymphostasis was achieved with cold clips and  was set aside labeled left obturator internal iliac lymph nodes, set  aside for permanent pathology. A mirror image lymphadenectomy was  performed on the right side with identical boundaries. The obturator  nerves bilaterally were inspected following this maneuver and found to  be uninjured and there appeared to be excellent hemostasis. Next, the  area of the ileocecal junction was identified robotically and traced  superiorly for distance approximately 20 cm and tagged with absorbable  suture. This absorbable suture along with the distal ureteral areas  were grasped laparoscopically.   Robot was undocked. The patient was at  level. All ports except for the laparoscopic grasper port were removed.  The previous camera port site was extended inferiorly for total distance  approximately 10 cm, coursing around the left side of the umbilicus and  the peritoneal cavity was entered. The cystoprostatectomy system was  removed and set aside for permanent pathology. The Omni-Tract retractor  was carefully deployed, was focused on excellent visualization of the  ileocecal junction in previously tagged ureteral edges. Notably, the  patient had a previous right ureteral stent, this was purposely severed  during previous ureteral ligation and the proximal end was inspected and  discarded. A segment of terminal ileum approximately 20 cm in length  just proximal to the previously  tagged area was identified and appeared  to be suitable for ileal conduit. It was taken out of continuity using  bowel load stapler followed by vascular load stapler of the mesentery x2  each side taking great care to avoid vascular compromise to the segment  and this did not occur. Segment was then placed lateral to the  remainder of the internal ileum thus retroperitonealizing it and the  bowel was brought back in the continuity using side-to-side staple  anastomosis in the pelvis with stapler x2. The free end was oversewn  using running silk x2, first at approximating layers and second with  imbricating layer. Mesenteric defect was oversewn using interrupted  silk as was the acute angle of the bowel anastomosis. Anastomosis was  palpably patent and visibly liable. Conduit segment was placed into  proper orientation and did appear this to be a suitable length as with  the ureters for ureteroenteric anastomosis. The distal bowel staple  line was removed and first, the left ureter anastomosis was performed, a  small segment of bowel serosa approximately 8 mm in diameter was excised  near the butt end of the conduit as was the bowel serosa. Four tacking  sutures were placed at this level thus everting the bowel mucosa outside  the serosa to allow circumferential visualization of this area. A blue-  colored Bander stent was placed across this and to level of a 24-cm to  the ureteroenteric anastomosis. The posterior heel stitch was applied  of 4-0 Vicryl and two separate  running suture lines of 4-0 Vicryl were  used to perform ureteroenteric anastomosis, which resulted in excellent  wide open anastomosis that was visibly patent and watertight and viable.  Similarly, the right ureteroenteric anastomosis was performed at the  position approximately 2 cm more distal in the conduit in exact same  fashion, this time using a red-colored Bander stent to 26 cm to the  bowel anastomosis. There was  vigorous efflux of urine noted to the  distal end of the Bander stent. The retractors were then taken down.  All sponge and needle counts were correct. Closed suction drain was  brought through the previous right medial most robotic port sites at the  level of peritoneal cavity. The previous left lateral 12-mm assistant  port site was closed with fascia using figure-of-eight Vicryl. The skin  overlying the previously marked conduit site was corresponded to a 12-mm  port site was excised approximately 0.25-inch diameter and a column of  fiber fatty tissue was dissected to the level of the fascia. Four  corners of the fascia were identified and tacking Vicryl applied to  these edges. Conduit segment was then brought through this location and  again appeared to be viable and patent. It was tacked to the fascia  using the previously applied four sutures. Next, four everting sutures  were applied between the level of the skin and four corners of the  distal ileal conduit, which provide excellent eversion of the edges, was  again appeared to be pink and viable. Intervening skin sutures of 3-0  Vicryl were placed x2 between each tacking suture. The Bander stents  were further anchored using through-and-through chromic stitch to each  one via the middle aspect of the conduit x1 each loosely. Peritoneal  cavity was once again inspected. There was appeared to be excellent  hemostasis. The bowel was inspected as for the bowel anastomosis. All  areas were appeared to be patent without undo rotation. Omentum was  brought over the area of the abdominal incision. This area was  reapproximated at the level of fascia using figure-of-eight PDS. Next,  a On-Q pain catheter pump was brought directly on top of the fascia and  Scarpa was reapproximated using running Vicryl. All incision sites were  closed at the level of the skin using subcuticular Monocryl followed by  Dermabond.   Drain was placed to bulb  suction. The ostomy appliance was applied, and  again appeared to be excellent urine output from both Bander stents.  Procedure was then terminated. The patient tolerated the procedure  well. There were no immediate periprocedural complications. The  patient was taken to the postanesthesia care unit in stable condition.  ______________________________  Alexis Frock, MD  TM/MEDQ D: 03/25/2013 T: 03/26/2013 Job: [id number]

## 2013-04-09 ENCOUNTER — Telehealth: Payer: Self-pay

## 2013-04-09 ENCOUNTER — Other Ambulatory Visit: Payer: Self-pay

## 2013-04-09 MED ORDER — POTASSIUM CHLORIDE ER 10 MEQ PO TBCR: 10.0000 meq | EXTENDED_RELEASE_TABLET | Freq: Every day | ORAL | Status: DC

## 2013-04-09 MED ORDER — FUROSEMIDE 20 MG PO TABS: 20.0000 mg | ORAL_TABLET | Freq: Every day | ORAL | Status: DC

## 2013-04-09 NOTE — Telephone Encounter (Signed)
I talked with the nurse may have noted that he is having swelling in the lower legs. There is no other complications no shortness of breath his lungs sounded clear heart was regular extremities didn't have 2+ pitting edema I recommend treatment with Lasix 20 mg every morning potassium 10 mEq every morning he is to followup here in the office somewhere in the next few weeks increased ambulation recommended if any complications followup sooner the home health nurse is aware of this they will do some blood work metabolic 7 and CBC next week they will also have the patient followup, and our nurse will send in the medications.

## 2013-04-09 NOTE — Telephone Encounter (Signed)
Gregory Santana (advanced home care nurse) called and stated that patient arrived home on 03/31/13 after having a urostomy bag placed. He is now having bilateral swelling in the lower extremities. 2+ pitting edema. Lasix 20 mg # 30 4 RFs and KCL 10 meq # 30 4 RFS was sent to [facility].

## 2013-04-20 ENCOUNTER — Ambulatory Visit: Payer: Medicare PPO | Admitting: Cardiovascular Disease

## 2013-05-12 ENCOUNTER — Telehealth: Payer: Self-pay | Admitting: Family Medicine

## 2013-05-12 MED ORDER — LORAZEPAM 0.5 MG PO TABS: 0.5000 mg | ORAL_TABLET | Freq: Every evening | ORAL | Status: DC | PRN

## 2013-05-12 NOTE — Telephone Encounter (Signed)
May have this +3 refills. Patient does need to have followup visit later this spring.

## 2013-05-12 NOTE — Telephone Encounter (Signed)
Rx faxed to pharmacy. Patient notified. 

## 2013-05-12 NOTE — Telephone Encounter (Signed)
Last seen 03/05/13

## 2013-05-12 NOTE — Telephone Encounter (Signed)
Patient needs Rx for lorazepam. He is completely out. [employer].

## 2013-05-15 ENCOUNTER — Telehealth: Payer: Self-pay | Admitting: Hematology & Oncology

## 2013-05-15 NOTE — Telephone Encounter (Signed)
Spoke w NEW PATIENT dau Erline Levine) today to remind them of their appointment with Dr. Marin Olp. Also, advised them to bring all meds and insurance information.

## 2013-05-19 ENCOUNTER — Other Ambulatory Visit: Payer: Medicare PPO | Admitting: Lab

## 2013-05-19 ENCOUNTER — Encounter (HOSPITAL_BASED_OUTPATIENT_CLINIC_OR_DEPARTMENT_OTHER): Payer: Medicare PPO | Admitting: Hematology & Oncology

## 2013-05-19 ENCOUNTER — Ambulatory Visit: Payer: Medicare PPO

## 2013-05-20 ENCOUNTER — Telehealth: Payer: Self-pay | Admitting: Hematology & Oncology

## 2013-05-20 NOTE — Telephone Encounter (Signed)
Left message for them to call and reschedule 4-21 no show

## 2013-05-23 NOTE — Progress Notes (Signed)
This encounter was created in error - please disregard.

## 2013-05-29 ENCOUNTER — Encounter: Payer: Self-pay | Admitting: Nurse Practitioner

## 2013-05-29 NOTE — Progress Notes (Signed)
LVM on pt's home personal machine, reminding him that he missed a scheduled appointment on 05/19/13. Encouraged him to contact our office to rescheduled. Notification faxed to [facility] informing them that pt was a No Show for his appointment.

## 2013-06-16 ENCOUNTER — Telehealth: Payer: Self-pay | Admitting: Hematology & Oncology

## 2013-06-16 NOTE — Telephone Encounter (Signed)
Left vm w NEW PATIENT today to remind them of their appointment with Dr. Ennever. Also, advised them to bring all medication bottles and insurance card information. ° °

## 2013-06-18 ENCOUNTER — Other Ambulatory Visit: Payer: Self-pay | Admitting: Hematology & Oncology

## 2013-06-18 ENCOUNTER — Ambulatory Visit (HOSPITAL_BASED_OUTPATIENT_CLINIC_OR_DEPARTMENT_OTHER): Payer: Medicare PPO | Admitting: Hematology & Oncology

## 2013-06-18 ENCOUNTER — Encounter: Payer: Self-pay | Admitting: Hematology & Oncology

## 2013-06-18 ENCOUNTER — Ambulatory Visit: Payer: Medicare PPO | Admitting: Lab

## 2013-06-18 ENCOUNTER — Telehealth: Payer: Self-pay | Admitting: Hematology & Oncology

## 2013-06-18 ENCOUNTER — Ambulatory Visit: Payer: Medicare PPO

## 2013-06-18 VITALS — BP 150/66 | HR 71 | Temp 98.0°F | Resp 18 | Ht 72.0 in | Wt 241.0 lb

## 2013-06-18 DIAGNOSIS — C8589 Other specified types of non-Hodgkin lymphoma, extranodal and solid organ sites: Secondary | ICD-10-CM

## 2013-06-18 DIAGNOSIS — C679 Malignant neoplasm of bladder, unspecified: Secondary | ICD-10-CM

## 2013-06-18 DIAGNOSIS — C884 Extranodal marginal zone B-cell lymphoma of mucosa-associated lymphoid tissue [MALT-lymphoma]: Secondary | ICD-10-CM

## 2013-06-18 DIAGNOSIS — C8590 Non-Hodgkin lymphoma, unspecified, unspecified site: Secondary | ICD-10-CM

## 2013-06-18 DIAGNOSIS — F17200 Nicotine dependence, unspecified, uncomplicated: Secondary | ICD-10-CM

## 2013-06-18 LAB — CBC WITH DIFFERENTIAL (CANCER CENTER ONLY)
BASO#: 0 10*3/uL (ref 0.0–0.2)
BASO%: 0.4 % (ref 0.0–2.0)
EOS%: 2.6 % (ref 0.0–7.0)
Eosinophils Absolute: 0.2 10*3/uL (ref 0.0–0.5)
HCT: 41.6 % (ref 38.7–49.9)
HGB: 13 g/dL (ref 13.0–17.1)
LYMPH#: 2.8 10*3/uL (ref 0.9–3.3)
LYMPH%: 30.5 % (ref 14.0–48.0)
MCH: 27.6 pg — ABNORMAL LOW (ref 28.0–33.4)
MCHC: 31.3 g/dL — ABNORMAL LOW (ref 32.0–35.9)
MCV: 88 fL (ref 82–98)
MONO#: 0.9 10*3/uL (ref 0.1–0.9)
MONO%: 10 % (ref 0.0–13.0)
NEUT#: 5.1 10*3/uL (ref 1.5–6.5)
NEUT%: 56.5 % (ref 40.0–80.0)
PLATELETS: 221 10*3/uL (ref 145–400)
RBC: 4.71 10*6/uL (ref 4.20–5.70)
RDW: 15.5 % (ref 11.1–15.7)
WBC: 9.1 10*3/uL (ref 4.0–10.0)

## 2013-06-18 LAB — LACTATE DEHYDROGENASE: LDH: 127 U/L (ref 94–250)

## 2013-06-18 LAB — CHCC SATELLITE - SMEAR

## 2013-06-18 NOTE — Telephone Encounter (Signed)
I called [phone] [place] and spoke with Izora Ribas due to patient having [facility] insurance and needing a referral.  I gave Izora Ribas information and she stated we will receive a fax from [facility] in a few days

## 2013-06-18 NOTE — Progress Notes (Signed)
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 NEW PATIENT  : I am here to have U. tell me what is wrong  HPI: Gregory Santana is a very nice 73 year old gentleman. He has had problems with hematuria. He has seen Dr. Tresa Moore of urology. He was found to have a high-grade bladder cancer. Dr. Tammi Klippel the day and cystoprostatectomy. This was done October 25. 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 The tumor measured 5 cm. There was no obvious lymphovascular invasion. All margins were negative.  9 lymph nodes were sampled. None contained in bladder cancer. However, there were 3 nodes that contained low-grade B cell not his lymphoma. The lymphoma cells were CD5 negative, CD10 negative, cyclinD1 negative and light chain negative. 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 The cells were CD20 positive  He has a urostomy bag. He's doing okay. He lost quite awake after surgery.  Her prior to surgery, he was doing well. He had a good appetite. There is no cough. He has smoked quite a bit. He probably has about a 55 pack year history of tobacco use.  HEENT had not noted any palpable lymph glands. He does have a psoriasis. He really is not on any medication for this.  He was kindly referred to [place] to see if we had to do anything for the lymphoma.  He's had no change in bowel or bladder habits. His urostomy is working well. He's had no dysphagia.  Overall, his performance status is ECOG 1          Past Medical History  Diagnosis Date  . Hypertension   . Hyperlipidemia   . Stroke 2002    no deficits  . Cancer     bladder  . Headache(784.0)     migraines  . Psoriasis 1/15    abdomen, sides of chest  :  Past Surgical History  Procedure Laterality Date  . Hernia repair Left     inguinal  . Transurethral  resection of bladder tumor with gyrus (turbt-gyrus) N/A 02/06/2013    Procedure: TRANSURETHRAL RESECTION OF BLADDER TUMOR WITH GYRUS (TURBT-GYRUS);  Surgeon: Alexis Frock, MD;  Location: [facility];  Service: Urology;  Laterality: N/A;  . Cystoscopy w/ ureteral stent placement Right 02/06/2013    Procedure: CYSTOSCOPY WITH RIGHT RETROGRADE PYELOGRAM/RIGHT URETERAL STENT PLACEMENT;  Surgeon: Alexis Frock, MD;  Location: [facility];  Service: Urology;  Laterality: Right;  . Cystogram N/A 02/06/2013    Procedure: CYSTOGRAM;  Surgeon: Alexis Frock, MD;  Location: [facility];  Service: Urology;  Laterality: N/A;  . Robot assisted laparoscopic complete cystect ileal conduit N/A 03/25/2013    Procedure: ROBOTIC ASSISTED LAPAROSCOPIC COMPLETE CYSTECT ILEAL CONDUIT, EXTENSIVE ADHESIOLYSIS;  Surgeon: Alexis Frock, MD;  Location: [facility];  Service: Urology;  Laterality: N/A;  :  Current outpatient prescriptions:aspirin 325 MG EC tablet, Take 325 mg by mouth daily., Disp: , Rfl: ;  atorvastatin (LIPITOR) 20 MG tablet, Take 20 mg by mouth daily., Disp: , Rfl: ;  metoprolol succinate (TOPROL-XL) 25 MG 24 hr tablet, Take 25 mg by mouth every morning., Disp: , Rfl: :  :  No Known Allergies:  Family History  Problem  Relation Age of Onset  . Hypertension Mother   :  History   Social History  . Marital Status: Widowed    Spouse Name: N/A    Number of Children: N/A  . Years of Education: N/A   Occupational History  . Not on file.   Social History Main Topics  . Smoking status: Current Every Day Smoker -- 1.50 packs/day for 50 years    Types: Cigarettes    Start date: 04/17/1957  . Smokeless tobacco: Never Used     Comment: 06-18-13 still smoking  . Alcohol Use: No  . Drug Use: No  . Sexual Activity: Not on file   Other Topics Concern  . Not on file   Social History Narrative  . No narrative on file  :  Pertinent items are noted in HPI.  Exam: @IPVITALS @  well-developed and well-nourished  gentleman. Vital signs show a temperature of 98. Pulse is 71. Blood pressure 150/66. Weight 2 and 41 pounds. Head and neck exam shows no ocular or oral lesions. There are no palpable cervical or supraclavicular lymph nodes. Lungs are clear. Cardiac exam regular in rhythm with no murmurs rubs or bruits. Axillary exam shows no bilateral axillary adenopathy. Abdomen soft. He has been laparoscopy to be scars. He has his urostomy. No palpable liver or spleen tip is noted. Back exam no tenderness over the spine ribs or hips. Extremities shows no clubbing cyanosis or edema. Neurological exam shows no focal neurological deficits. Skin exam does show some patches of psoriasis on his anterior abdominal wall.    Recent Labs  06/18/13 1528  WBC 9.1  HGB 13.0  HCT 41.6  PLT 221   No results found for this basename: NA, K, CL, CO2, GLUCOSE, BUN, CREATININE, CALCIUM,  in the last 72 hours  Blood smear review: No data  Pathology: See above     Assessment and Plan: 73 year old gentleman. 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 This was in, I think, 3 lymph nodes. From the path report, as it does not appear that these lymph nodes or really pathologically enlarged. There is no obvious extranodal spread.  I suspect that he probably has had this for several years. He was only found at the time of surgery and obviously was an unexpected finding.  In the "big picture", it is much better that he has lymphoma in the left does and how bladder cancer. He had bladder cancer, then he probably would be at least stage III and maybe mistake for which would be a much more ominous finding.  From my point of view, I really don't think we have to do an extensive workup. Again, he is totally asymptomatic. I would don't believe that put him through scans would change our management recommendations. He does not need a bone marrow test.  He came in with his daughter. I spent a good hour with him. I  had a very nice time with them.  I think that we can probably just get him back in a year. I told them that he can certainly come back sooner if needed. I said that things to watch out for would include weight loss, fevers, worsening rash, abdominal pain, decreased appetite, bleeding.  I would worry much more about bladder cancer and then lymphoma.  I worry about him smoking. I talked to him about smoking cessation. This just is not going to happen.  Gregory Santana is a real nice guy.Marland Kitchen He  is  doing well. He will come back to see [place] in about a year.

## 2013-06-18 NOTE — Patient Instructions (Signed)
You Can Quit Smoking If you are ready to quit smoking or are thinking about it, congratulations! You have chosen to help yourself be healthier and live longer! There are lots of different ways to quit smoking. Nicotine gum, nicotine patches, a nicotine inhaler, or nicotine nasal spray can help with physical craving. Hypnosis, support groups, and medicines help break the habit of smoking. TIPS TO GET OFF AND STAY OFF CIGARETTES  Learn to predict your moods. Do not let a bad situation be your excuse to have a cigarette. Some situations in your life might tempt you to have a cigarette.  Ask friends and co-workers not to smoke around you.  Make your home smoke-free.  Never have "just one" cigarette. It leads to wanting another and another. Remind yourself of your decision to quit.  On a card, make a list of your reasons for not smoking. Read it at least the same number of times a day as you have a cigarette. Tell yourself everyday, "I do not want to smoke. I choose not to smoke."  Ask someone at home or work to help you with your plan to quit smoking.  Have something planned after you eat or have a cup of coffee. Take a walk or get other exercise to perk you up. This will help to keep you from overeating.  Try a relaxation exercise to calm you down and decrease your stress. Remember, you may be tense and nervous the first two weeks after you quit. This will pass.  Find new activities to keep your hands busy. Play with a pen, coin, or rubber band. Doodle or draw things on paper.  Brush your teeth right after eating. This will help cut down the craving for the taste of tobacco after meals. You can try mouthwash too.  Try gum, breath mints, or diet candy to keep something in your mouth. IF YOU SMOKE AND WANT TO QUIT:  Do not stock up on cigarettes. Never buy a carton. Wait until one pack is finished before you buy another.  Never carry cigarettes with you at work or at home.  Keep cigarettes  as far away from you as possible. Leave them with someone else.  Never carry matches or a lighter with you.  Ask yourself, "Do I need this cigarette or is this just a reflex?"  Bet with someone that you can quit. Put cigarette money in a piggy bank every morning. If you smoke, you give up the money. If you do not smoke, by the end of the week, you keep the money.  Keep trying. It takes 21 days to change a habit!  Talk to your doctor about using medicines to help you quit. These include nicotine replacement gum, lozenges, or skin patches. Document Released: 11/11/2008 Document Revised: 04/09/2011 Document Reviewed: 11/11/2008 Exit[employer] Patient Information 2014 [employer].  

## 2013-06-19 ENCOUNTER — Telehealth: Payer: Self-pay | Admitting: Hematology & Oncology

## 2013-06-19 NOTE — Telephone Encounter (Signed)
Mailed 05-2014 schedule °

## 2013-06-24 LAB — COMPREHENSIVE METABOLIC PANEL
ALK PHOS: 71 U/L (ref 39–117)
ALT: 8 U/L (ref 0–53)
AST: 11 U/L (ref 0–37)
Albumin: 3.8 g/dL (ref 3.5–5.2)
BILIRUBIN TOTAL: 0.5 mg/dL (ref 0.2–1.2)
BUN: 20 mg/dL (ref 6–23)
CO2: 28 meq/L (ref 19–32)
CREATININE: 1.38 mg/dL — AB (ref 0.50–1.35)
Calcium: 9.7 mg/dL (ref 8.4–10.5)
Chloride: 106 mEq/L (ref 96–112)
GLUCOSE: 104 mg/dL — AB (ref 70–99)
Potassium: 4.8 mEq/L (ref 3.5–5.3)
Sodium: 143 mEq/L (ref 135–145)
Total Protein: 7.5 g/dL (ref 6.0–8.3)

## 2013-06-24 LAB — PROTEIN ELECTROPHORESIS, SERUM, WITH REFLEX
ALBUMIN ELP: 51.8 % — AB (ref 55.8–66.1)
ALPHA-1-GLOBULIN: 4.3 % (ref 2.9–4.9)
ALPHA-2-GLOBULIN: 10.4 % (ref 7.1–11.8)
Beta 2: 5.9 % (ref 3.2–6.5)
Beta Globulin: 7.1 % (ref 4.7–7.2)
Gamma Globulin: 20.5 % — ABNORMAL HIGH (ref 11.1–18.8)
M-Spike, %: 0.42 g/dL
TOTAL PROTEIN, SERUM ELECTROPHOR: 7.5 g/dL (ref 6.0–8.3)

## 2013-06-24 LAB — BETA 2 MICROGLOBULIN, SERUM: Beta-2 Microglobulin: 3.71 mg/L — ABNORMAL HIGH (ref <=2.51)

## 2013-06-24 LAB — KAPPA/LAMBDA LIGHT CHAINS
KAPPA FREE LGHT CHN: 8.94 mg/dL — AB (ref 0.33–1.94)
KAPPA LAMBDA RATIO: 2.7 — AB (ref 0.26–1.65)
LAMBDA FREE LGHT CHN: 3.31 mg/dL — AB (ref 0.57–2.63)

## 2013-06-24 LAB — IGG, IGA, IGM
IGG (IMMUNOGLOBIN G), SERUM: 1520 mg/dL (ref 650–1600)
IgA: 374 mg/dL (ref 68–379)
IgM, Serum: 98 mg/dL (ref 41–251)

## 2013-06-24 LAB — IFE INTERPRETATION

## 2013-06-25 ENCOUNTER — Ambulatory Visit: Payer: Medicare PPO | Admitting: Cardiovascular Disease

## 2013-06-26 ENCOUNTER — Encounter: Payer: Self-pay | Admitting: *Deleted

## 2013-07-24 ENCOUNTER — Encounter: Payer: Self-pay | Admitting: *Deleted

## 2013-08-18 ENCOUNTER — Encounter: Payer: Self-pay | Admitting: *Deleted

## 2013-08-28 ENCOUNTER — Other Ambulatory Visit (HOSPITAL_COMMUNITY): Payer: Self-pay | Admitting: Urology

## 2013-08-28 ENCOUNTER — Ambulatory Visit (HOSPITAL_COMMUNITY)
Admission: RE | Admit: 2013-08-28 | Discharge: 2013-08-28 | Disposition: A | Discharge status: Home / Self Care | Payer: Medicare PPO | Source: Ambulatory Visit | Attending: Urology | Admitting: Urology

## 2013-08-28 DIAGNOSIS — C670 Malignant neoplasm of trigone of bladder: Secondary | ICD-10-CM

## 2013-09-19 ENCOUNTER — Other Ambulatory Visit: Payer: Self-pay | Admitting: Family Medicine

## 2013-10-21 ENCOUNTER — Encounter: Payer: Self-pay | Admitting: *Deleted

## 2013-11-18 ENCOUNTER — Other Ambulatory Visit: Payer: Self-pay | Admitting: Family Medicine

## 2013-12-09 ENCOUNTER — Other Ambulatory Visit: Payer: Self-pay | Admitting: Family Medicine

## 2013-12-09 ENCOUNTER — Other Ambulatory Visit: Payer: Self-pay | Admitting: Cardiovascular Disease

## 2013-12-21 ENCOUNTER — Telehealth: Payer: Self-pay | Admitting: Family Medicine

## 2013-12-21 NOTE — Telephone Encounter (Signed)
Discussed with daughter that med was not denied. We sent a 30 day supply to pharm on 11/13. Pt should have enough to get through til appt on 12/1.

## 2013-12-21 NOTE — Telephone Encounter (Signed)
atorvastatin (LIPITOR) 20 MG tablet   Pts daughter calling because this med has been denied by our office  Stating pt needs an appt, he already had an appt scheduled at that time an  Was told to call an have it refilled due to the schedule being so tight an was unable to  Get in sooner.   Please fill this today, thanks    Chrys Racer apoth

## 2013-12-29 ENCOUNTER — Ambulatory Visit: Payer: Medicare PPO | Admitting: Family Medicine

## 2014-01-01 ENCOUNTER — Telehealth: Payer: Self-pay | Admitting: Family Medicine

## 2014-01-01 MED ORDER — ATORVASTATIN CALCIUM 20 MG PO TABS: 20.0000 mg | ORAL_TABLET | Freq: Every day | ORAL | Status: DC

## 2014-01-01 MED ORDER — METOPROLOL SUCCINATE ER 25 MG PO TB24: 25.0000 mg | ORAL_TABLET | Freq: Every day | ORAL | Status: DC

## 2014-01-01 NOTE — Telephone Encounter (Signed)
Jury note ready for pickup, 30 day supply of meds sent to pharmacy, BP medication will be addressed at office visit per Dr. Nicki Reaper. Daughter was notified by front staff.

## 2014-01-01 NOTE — Telephone Encounter (Signed)
Patient missed his appointment on 12/29/13 because his daughter thought it was today.   I told her we could schedule him again, but would not be until the week 01/11/14. She said that would be too late because there are several things to discuss.  First, he needs a letter wrote excusing him for jury duty on 01/11/14.  Also, he needs refills on his atorvastatin (LIPITOR) 20 MG tablet and metoprolol succinate (TOPROL-XL) 25 MG 24 hr tablet to [facility].  Also, before patients bladder removal surgery, Dr. Nicki Reaper took patient off of his BP medicine. Does he need to start taking this again?

## 2014-01-07 ENCOUNTER — Encounter: Payer: Self-pay | Admitting: Family Medicine

## 2014-01-07 ENCOUNTER — Ambulatory Visit (INDEPENDENT_AMBULATORY_CARE_PROVIDER_SITE_OTHER): Payer: Medicare PPO | Admitting: Family Medicine

## 2014-01-07 VITALS — BP 130/80 | Temp 97.9°F | Ht 73.0 in | Wt 257.1 lb

## 2014-01-07 DIAGNOSIS — L409 Psoriasis, unspecified: Secondary | ICD-10-CM

## 2014-01-07 DIAGNOSIS — R5383 Other fatigue: Secondary | ICD-10-CM

## 2014-01-07 DIAGNOSIS — Z125 Encounter for screening for malignant neoplasm of prostate: Secondary | ICD-10-CM

## 2014-01-07 DIAGNOSIS — E785 Hyperlipidemia, unspecified: Secondary | ICD-10-CM

## 2014-01-07 DIAGNOSIS — Z23 Encounter for immunization: Secondary | ICD-10-CM

## 2014-01-07 DIAGNOSIS — I10 Essential (primary) hypertension: Secondary | ICD-10-CM

## 2014-01-07 DIAGNOSIS — Z79899 Other long term (current) drug therapy: Secondary | ICD-10-CM

## 2014-01-07 MED ORDER — METOPROLOL SUCCINATE ER 25 MG PO TB24: 25.0000 mg | ORAL_TABLET | Freq: Every day | ORAL | Status: DC

## 2014-01-07 MED ORDER — ATORVASTATIN CALCIUM 20 MG PO TABS: 20.0000 mg | ORAL_TABLET | Freq: Every day | ORAL | Status: DC

## 2014-01-07 NOTE — Patient Instructions (Signed)
Smoking Cessation Quitting smoking is important to your health and has many advantages. However, it is not always easy to quit since nicotine is a very addictive drug. Oftentimes, people try 3 times or more before being able to quit. This document explains the best ways for you to prepare to quit smoking. Quitting takes hard work and a lot of effort, but you can do it. ADVANTAGES OF QUITTING SMOKING  You will live longer, feel better, and live better.  Your body will feel the impact of quitting smoking almost immediately.  Within 20 minutes, blood pressure decreases. Your pulse returns to its normal level.  After 8 hours, carbon monoxide levels in the blood return to normal. Your oxygen level increases.  After 24 hours, the chance of having a heart attack starts to decrease. Your breath, hair, and body stop smelling like smoke.  After 48 hours, damaged nerve endings begin to recover. Your sense of taste and smell improve.  After 72 hours, the body is virtually free of nicotine. Your bronchial tubes relax and breathing becomes easier.  After 2 to 12 weeks, lungs can hold more air. Exercise becomes easier and circulation improves.  The risk of having a heart attack, stroke, cancer, or lung disease is greatly reduced.  After 1 year, the risk of coronary heart disease is cut in half.  After 5 years, the risk of stroke falls to the same as a nonsmoker.  After 10 years, the risk of lung cancer is cut in half and the risk of other cancers decreases significantly.  After 15 years, the risk of coronary heart disease drops, usually to the level of a nonsmoker.  If you are pregnant, quitting smoking will improve your chances of having a healthy baby.  The people you live with, especially any children, will be healthier.  You will have extra money to spend on things other than cigarettes. QUESTIONS TO THINK ABOUT BEFORE ATTEMPTING TO QUIT You may want to talk about your answers with your  health care provider.  Why do you want to quit?  If you tried to quit in the past, what helped and what did not?  What will be the most difficult situations for you after you quit? How will you plan to handle them?  Who can help you through the tough times? Your family? Friends? A health care provider?  What pleasures do you get from smoking? What ways can you still get pleasure if you quit? Here are some questions to ask your health care provider:  How can you help me to be successful at quitting?  What medicine do you think would be best for me and how should I take it?  What should I do if I need more help?  What is smoking withdrawal like? How can I get information on withdrawal? GET READY  Set a quit date.  Change your environment by getting rid of all cigarettes, ashtrays, matches, and lighters in your home, car, or work. Do not let people smoke in your home.  Review your past attempts to quit. Think about what worked and what did not. GET SUPPORT AND ENCOURAGEMENT You have a better chance of being successful if you have help. You can get support in many ways.  Tell your family, friends, and coworkers that you are going to quit and need their support. Ask them not to smoke around you.  Get individual, group, or telephone counseling and support. Programs are available at local hospitals and health centers. Call   your local health department for information about programs in your area.  Spiritual beliefs and practices may help some smokers quit.  Download a "quit meter" on your computer to keep track of quit statistics, such as how long you have gone without smoking, cigarettes not smoked, and money saved.  Get a self-help book about quitting smoking and staying off tobacco. LEARN NEW SKILLS AND BEHAVIORS  Distract yourself from urges to smoke. Talk to someone, go for a walk, or occupy your time with a task.  Change your normal routine. Take a different route to work.  Drink tea instead of coffee. Eat breakfast in a different place.  Reduce your stress. Take a hot bath, exercise, or read a book.  Plan something enjoyable to do every day. Reward yourself for not smoking.  Explore interactive web-based programs that specialize in helping you quit. GET MEDICINE AND USE IT CORRECTLY Medicines can help you stop smoking and decrease the urge to smoke. Combining medicine with the above behavioral methods and support can greatly increase your chances of successfully quitting smoking.  Nicotine replacement therapy helps deliver nicotine to your body without the negative effects and risks of smoking. Nicotine replacement therapy includes nicotine gum, lozenges, inhalers, nasal sprays, and skin patches. Some may be available over-the-counter and others require a prescription.  Antidepressant medicine helps people abstain from smoking, but how this works is unknown. This medicine is available by prescription.  Nicotinic receptor partial agonist medicine simulates the effect of nicotine in your brain. This medicine is available by prescription. Ask your health care provider for advice about which medicines to use and how to use them based on your health history. Your health care provider will tell you what side effects to look out for if you choose to be on a medicine or therapy. Carefully read the information on the package. Do not use any other product containing nicotine while using a nicotine replacement product.  RELAPSE OR DIFFICULT SITUATIONS Most relapses occur within the first 3 months after quitting. Do not be discouraged if you start smoking again. Remember, most people try several times before finally quitting. You may have symptoms of withdrawal because your body is used to nicotine. You may crave cigarettes, be irritable, feel very hungry, cough often, get headaches, or have difficulty concentrating. The withdrawal symptoms are only temporary. They are strongest  when you first quit, but they will go away within 10-14 days. To reduce the chances of relapse, try to:  Avoid drinking alcohol. Drinking lowers your chances of successfully quitting.  Reduce the amount of caffeine you consume. Once you quit smoking, the amount of caffeine in your body increases and can give you symptoms, such as a rapid heartbeat, sweating, and anxiety.  Avoid smokers because they can make you want to smoke.  Do not let weight gain distract you. Many smokers will gain weight when they quit, usually less than 10 pounds. Eat a healthy diet and stay active. You can always lose the weight gained after you quit.  Find ways to improve your mood other than smoking. FOR MORE INFORMATION  www.smokefree.gov  Document Released: 01/09/2001 Document Revised: 06/01/2013 Document Reviewed: 04/26/2011 ExitCare Patient Information 2015 [employer]. This information is not intended to replace advice given to you by your health care provider. Make sure you discuss any questions you have with your health care provider.  

## 2014-01-07 NOTE — Progress Notes (Signed)
   Subjective:    Patient ID: Gregory Santana, male    DOB: 1940-11-13, 73 y.o.   MRN: [id number]  Hypertension This is a chronic problem. The current episode started more than 1 year ago. The problem has been gradually improving since onset. The problem is controlled. Pertinent negatives include no chest pain. There are no associated agents to hypertension. There are no known risk factors for coronary artery disease. Treatments tried: metoprolol. The current treatment provides significant improvement. There are no compliance problems.    Patient states that he has no other concerns at this time.   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 Review of Systems  Constitutional: Negative for activity change, appetite change and fatigue.  HENT: Negative for congestion.   Respiratory: Negative for cough.   Cardiovascular: Negative for chest pain.  Gastrointestinal: Negative for abdominal pain.  Endocrine: Negative for polydipsia and polyphagia.  Neurological: Negative for weakness.  Psychiatric/Behavioral: Negative for confusion.       Objective:   Physical Exam  Constitutional: He appears well-nourished. No distress.  Cardiovascular: Normal rate, regular rhythm and normal heart sounds.   No murmur heard. Pulmonary/Chest: Effort normal and breath sounds normal. No respiratory distress.  Musculoskeletal: He exhibits no edema.  Lymphadenopathy:    He has no cervical adenopathy.  Neurological: He is alert.  Psychiatric: His behavior is normal.  Vitals reviewed.         Assessment & Plan:  On physical exam this patient does not have any abdominal  tenderness. I doubt that he is having any, patients from his bladder cancer he will follow-up with urology somewhere in the next couple months. He will also follow-up with oncology by summertime. In addition to this patient will follow-up with [place] within the next couple months for a wellness exam  HTN decent control will not add any medicines to what he is doing lab work was ordered plus also refills were given.

## 2014-01-26 ENCOUNTER — Encounter: Payer: Self-pay | Admitting: *Deleted

## 2014-02-06 ENCOUNTER — Other Ambulatory Visit: Payer: Self-pay | Admitting: Family Medicine

## 2014-02-08 NOTE — Telephone Encounter (Signed)
Last seen 01/07/14

## 2014-02-09 NOTE — Telephone Encounter (Signed)
Ok this and 3 refills

## 2014-04-02 ENCOUNTER — Other Ambulatory Visit: Payer: Self-pay | Admitting: Family Medicine

## 2014-04-05 ENCOUNTER — Ambulatory Visit (HOSPITAL_COMMUNITY)
Admission: RE | Admit: 2014-04-05 | Discharge: 2014-04-05 | Disposition: A | Discharge status: Home / Self Care | Payer: Medicare PPO | Source: Ambulatory Visit | Attending: Urology | Admitting: Urology

## 2014-04-05 ENCOUNTER — Other Ambulatory Visit (HOSPITAL_COMMUNITY): Payer: Self-pay | Admitting: Urology

## 2014-04-05 DIAGNOSIS — C61 Malignant neoplasm of prostate: Secondary | ICD-10-CM | POA: Diagnosis present

## 2014-04-05 DIAGNOSIS — C670 Malignant neoplasm of trigone of bladder: Secondary | ICD-10-CM

## 2014-04-05 DIAGNOSIS — I517 Cardiomegaly: Secondary | ICD-10-CM | POA: Insufficient documentation

## 2014-04-12 IMAGING — CR DG CHEST 2V
2 series · 2 of 2 positions shown · non-contrast
Comparison: None.

CLINICAL DATA: Preop, bladder tumor

EXAM:
CHEST  2 VIEW

[w chest pa]
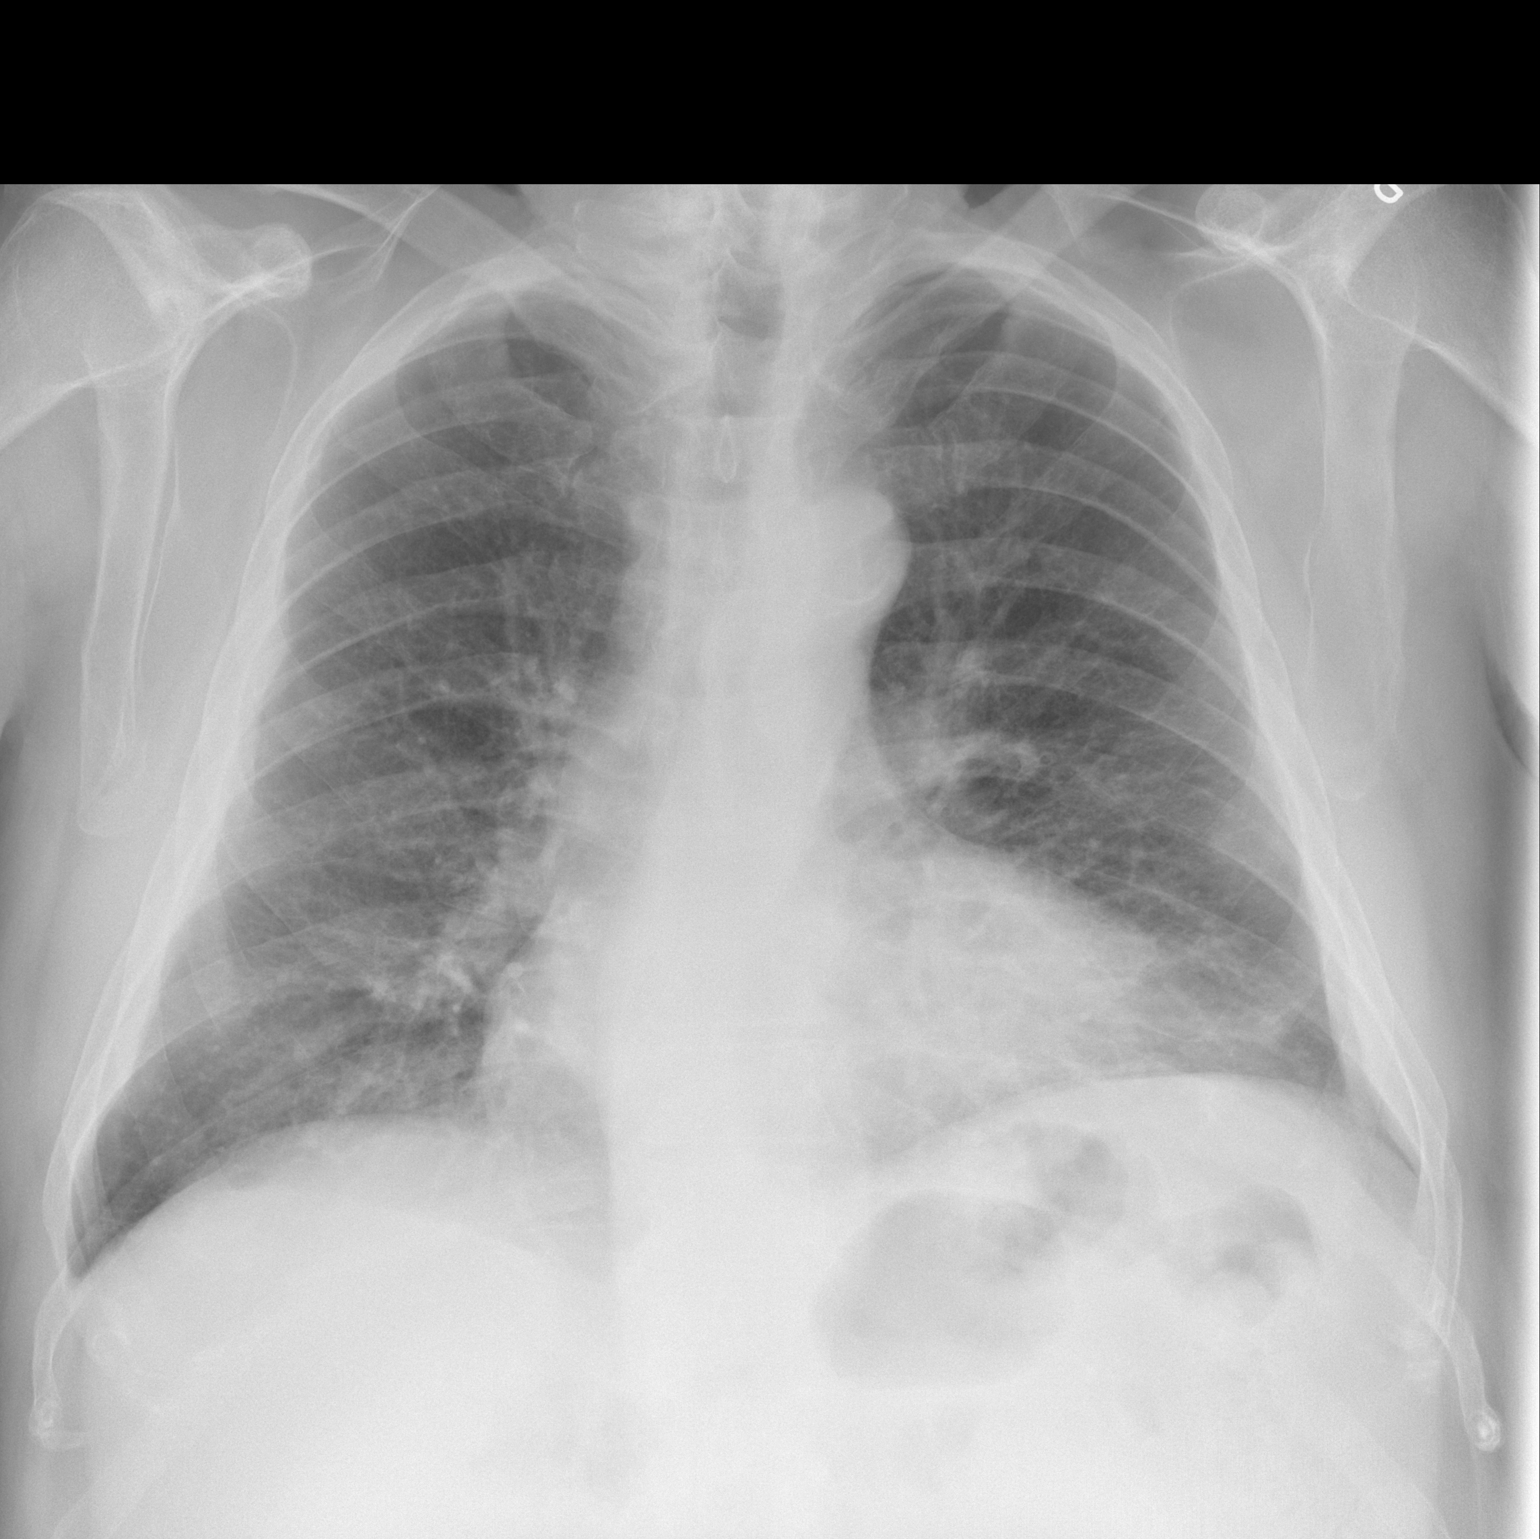

[w chest lat]
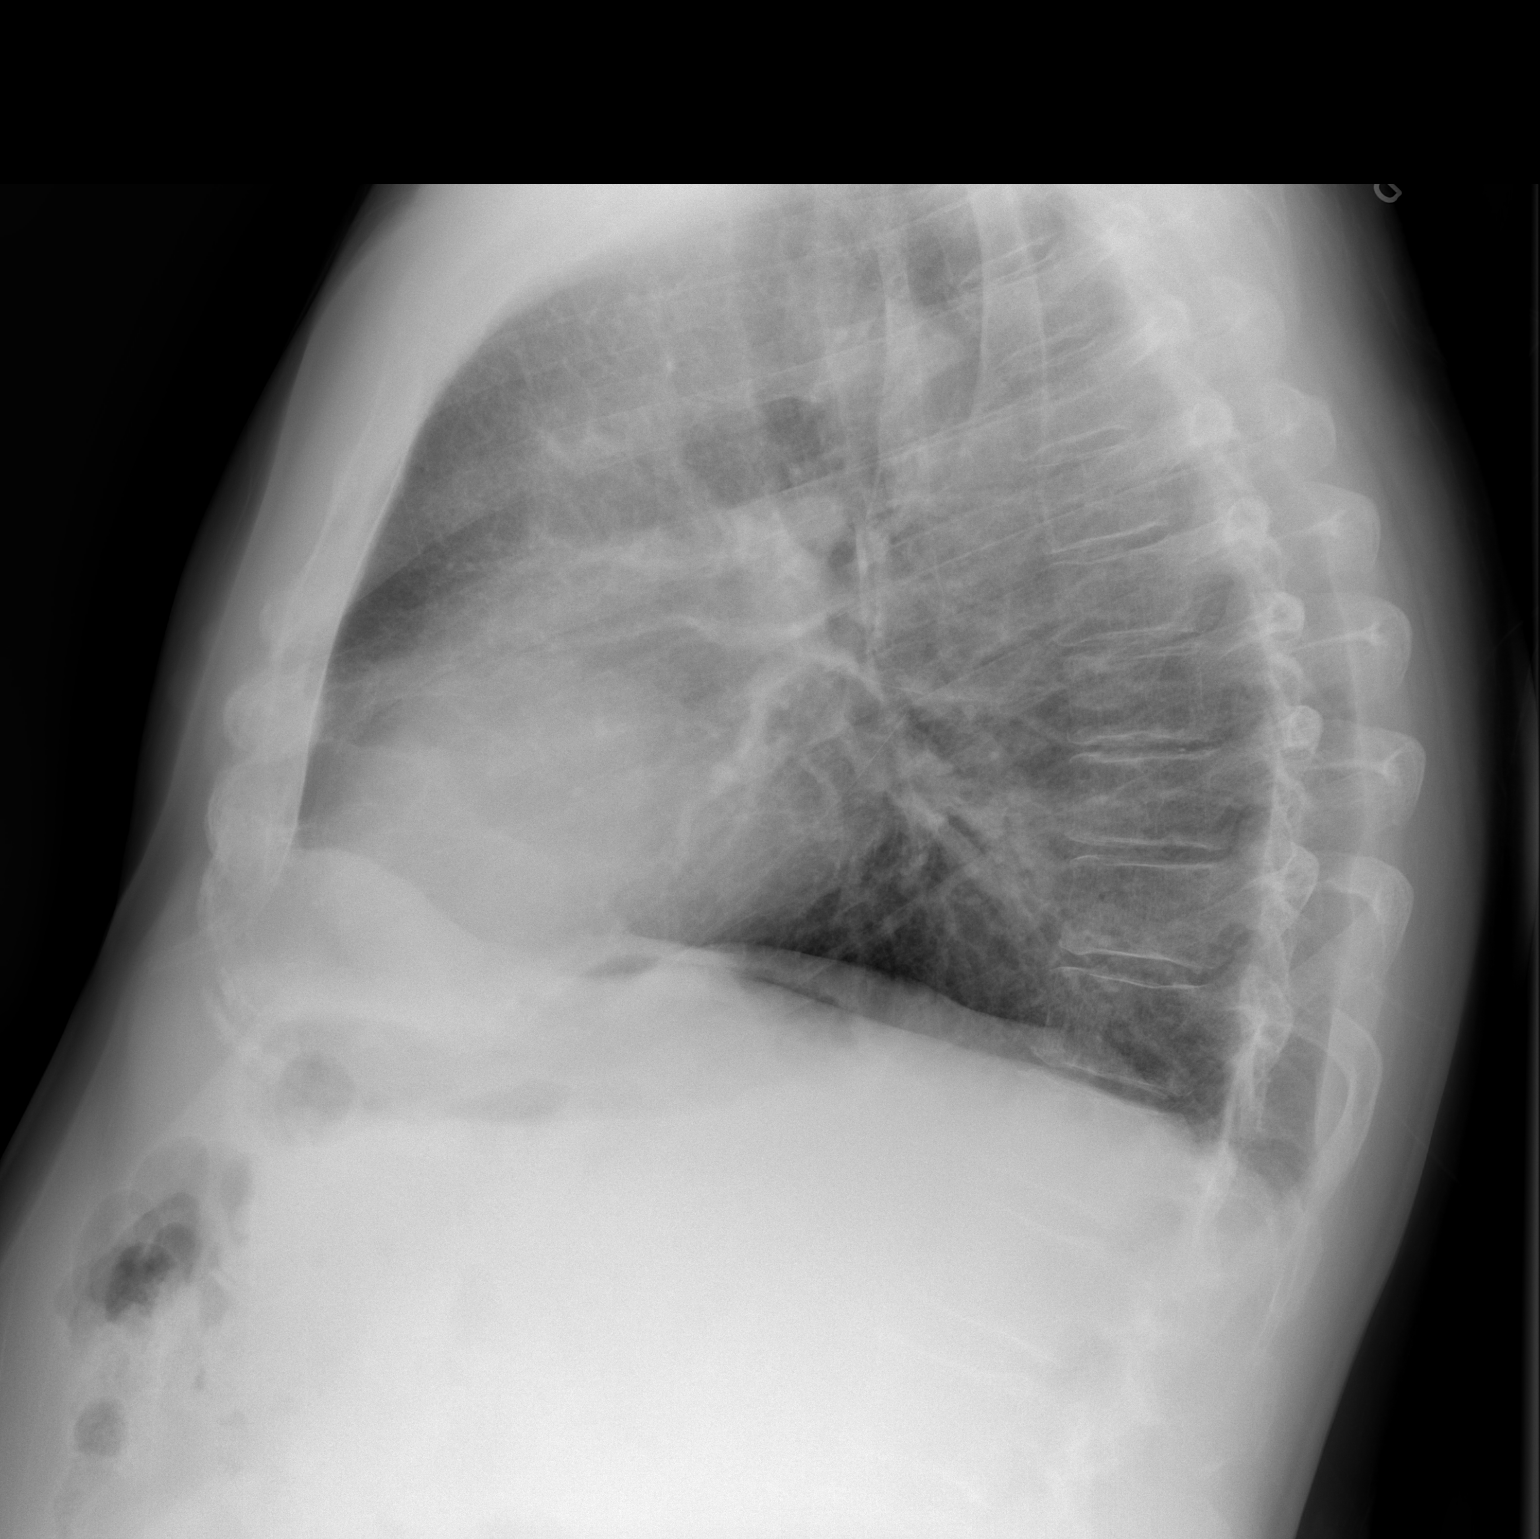

[2 of 2 positions shown; findings below may reference images not displayed]

FINDINGS: Cardiomediastinal silhouette is unremarkable. Mild hyperinflation.
No acute infiltrate or pulmonary edema. Bony thorax is unremarkable.
IMPRESSION: No active disease.  Mild hyperinflation.

## 2014-04-22 ENCOUNTER — Encounter: Payer: Self-pay | Admitting: Surgery

## 2014-04-26 ENCOUNTER — Encounter: Payer: Medicare PPO | Admitting: Surgery

## 2014-05-10 ENCOUNTER — Encounter: Payer: Self-pay | Admitting: Vascular Surgery

## 2014-05-11 ENCOUNTER — Encounter: Payer: Medicare PPO | Admitting: Vascular Surgery

## 2014-05-24 IMAGING — NM NM MYOCAR SINGLE W/SPECT W/WALL MOTION & EF
2 series · 12 of 12 positions shown · non-contrast
Comparison: None.

CLINICAL DATA: 72 yo male with no known history of CAD but with
multiple CAD risk factors referred for preoperative risk assessment.



[Series 1: cr cardiac tc low dose · 6.41mm/px · 6 of 64 frames shown]
[frame 6/64]
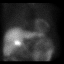
[frame 16/64]
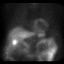
[frame 27/64]
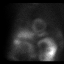
[frame 38/64]
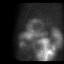
[frame 48/64]
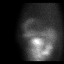
[frame 59/64]
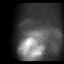

[Series 1: cs cardiac tc hi dose · 6.41mm/px · 6 of 512 frames shown]
[frame 43/512]
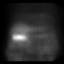
[frame 128/512]
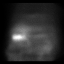
[frame 214/512]
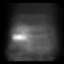
[frame 299/512]
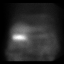
[frame 384/512]
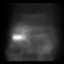
[frame 470/512]
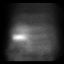

[12 of 12 positions shown; findings below may reference images not displayed]

FINDINGS: Pharmacological Stress

Baseline EKG showed normal sinus rhythm with LBBB. After infusion
heart rate increased from 100 bpm to 120 bpm and blood pressure
increased from 128/80 to 158/80 mmHg. The test was stopped after
injection was complete, the patient did not experience any chest
pain. Post-injection EKG was not interpretable for ischemia in the
setting of chronic LBBB

Myocardial Perfusion Imaging

Raw images showed appropriate radiotracer uptake. There is a medium
sized severe intensity fixed defect in the anteroseptal wall. There
is a large size severe intensity fixed defect involving nearly the
entire inferior and lateral walls.

[facility] imaging shows EDV 277 mL, ESV 225 mL, LVEF 19%. There is
severe inferior and lateral wall hypokinesis. The anteroseptal wall
is hypokinetic.
IMPRESSION: 1.  Abnormal Lexiscan MPI

2. Large area of myocardial scar involving nearly the entire
inferior and lateral walls.

3.  Moderate myocardial scar in the anteroseptal wall

4.  Left ventricular enlargement with decreased LV systolic function

5. High risk study based on low ejection fraction and multiple areas
of prior myocardial infarction. There is no current myocardium at
[facility]opard[facility].

## 2014-06-08 ENCOUNTER — Other Ambulatory Visit: Payer: Self-pay | Admitting: Family Medicine

## 2014-06-08 NOTE — Telephone Encounter (Signed)
This and 2 refills 

## 2014-06-16 ENCOUNTER — Telehealth: Payer: Self-pay | Admitting: Hematology & Oncology

## 2014-06-16 NOTE — Telephone Encounter (Signed)
Per Md and Rn to cx 06/17/14 apt and resch.  Apt has been resch for 06/22/14.  Patient was called and phone said "party  Not accepting calls at this time", could not leave message.  Son and daughter are on release form and Contact.  Daughter's number was called and her number was out of service.  Son was call no answer, so message was left of change apt and to call back to confirm apt.

## 2014-06-17 ENCOUNTER — Ambulatory Visit: Payer: Medicare PPO | Admitting: Hematology & Oncology

## 2014-06-17 ENCOUNTER — Other Ambulatory Visit: Payer: Medicare PPO

## 2014-06-21 ENCOUNTER — Other Ambulatory Visit: Payer: Self-pay | Admitting: *Deleted

## 2014-06-21 DIAGNOSIS — C679 Malignant neoplasm of bladder, unspecified: Secondary | ICD-10-CM

## 2014-06-22 ENCOUNTER — Ambulatory Visit: Payer: Medicare PPO | Admitting: Hematology & Oncology

## 2014-06-22 ENCOUNTER — Other Ambulatory Visit: Payer: Medicare PPO

## 2014-07-12 ENCOUNTER — Telehealth: Payer: Self-pay | Admitting: Family Medicine

## 2014-07-12 DIAGNOSIS — Z125 Encounter for screening for malignant neoplasm of prostate: Secondary | ICD-10-CM

## 2014-07-12 DIAGNOSIS — Z79899 Other long term (current) drug therapy: Secondary | ICD-10-CM

## 2014-07-12 DIAGNOSIS — E785 Hyperlipidemia, unspecified: Secondary | ICD-10-CM

## 2014-07-12 NOTE — Telephone Encounter (Signed)
Last labs from our office was in 2014 Lipid, BMP, PSA

## 2014-07-12 NOTE — Telephone Encounter (Signed)
Calling to get order for bloodwork.  He was supposed to have this done a long time ago but never did.

## 2014-07-12 NOTE — Telephone Encounter (Signed)
Lip liv m7 psa and wellness exam with dr Nicki Reaper

## 2014-07-12 NOTE — Telephone Encounter (Signed)
Gregory Santana notified blood work has been ordered. Transferred to front desk to schedule appointment.

## 2014-07-14 LAB — LIPID PANEL
CHOLESTEROL TOTAL: 110 mg/dL (ref 100–199)
Chol/HDL Ratio: 3.7 ratio units (ref 0.0–5.0)
HDL: 30 mg/dL — ABNORMAL LOW (ref >39)
LDL Calculated: 59 mg/dL (ref 0–99)
Triglycerides: 103 mg/dL (ref 0–149)
VLDL Cholesterol Cal: 21 mg/dL (ref 5–40)

## 2014-07-14 LAB — HEPATIC FUNCTION PANEL
ALT: 10 IU/L (ref 0–44)
AST: 14 IU/L (ref 0–40)
Albumin: 4.4 g/dL (ref 3.5–4.8)
Alkaline Phosphatase: 83 IU/L (ref 39–117)
BILIRUBIN, DIRECT: 0.17 mg/dL (ref 0.00–0.40)
Bilirubin Total: 0.5 mg/dL (ref 0.0–1.2)
Total Protein: 7.5 g/dL (ref 6.0–8.5)

## 2014-07-14 LAB — BASIC METABOLIC PANEL
BUN/Creatinine Ratio: 11 (ref 10–22)
BUN: 17 mg/dL (ref 8–27)
CALCIUM: 9.2 mg/dL (ref 8.6–10.2)
CO2: 23 mmol/L (ref 18–29)
Chloride: 102 mmol/L (ref 97–108)
Creatinine, Ser: 1.52 mg/dL — ABNORMAL HIGH (ref 0.76–1.27)
GFR calc non Af Amer: 45 mL/min/{1.73_m2} — ABNORMAL LOW (ref >59)
GFR, EST AFRICAN AMERICAN: 52 mL/min/{1.73_m2} — AB (ref >59)
GLUCOSE: 93 mg/dL (ref 65–99)
POTASSIUM: 4.6 mmol/L (ref 3.5–5.2)
Sodium: 142 mmol/L (ref 134–144)

## 2014-07-14 LAB — PSA: Prostate Specific Ag, Serum: <0.1 ng/mL (ref 0.0–4.0)

## 2014-07-20 ENCOUNTER — Ambulatory Visit: Payer: Medicare PPO | Admitting: Family Medicine

## 2014-07-21 ENCOUNTER — Encounter: Payer: Self-pay | Admitting: Family Medicine

## 2014-07-21 ENCOUNTER — Other Ambulatory Visit: Payer: Self-pay | Admitting: Family Medicine

## 2014-07-21 ENCOUNTER — Ambulatory Visit (INDEPENDENT_AMBULATORY_CARE_PROVIDER_SITE_OTHER): Payer: Medicare PPO | Admitting: Family Medicine

## 2014-07-21 VITALS — BP 132/80 | Ht 72.0 in | Wt 265.0 lb

## 2014-07-21 DIAGNOSIS — E785 Hyperlipidemia, unspecified: Secondary | ICD-10-CM

## 2014-07-21 DIAGNOSIS — G4700 Insomnia, unspecified: Secondary | ICD-10-CM | POA: Diagnosis not present

## 2014-07-21 DIAGNOSIS — D649 Anemia, unspecified: Secondary | ICD-10-CM

## 2014-07-21 DIAGNOSIS — I10 Essential (primary) hypertension: Secondary | ICD-10-CM

## 2014-07-21 DIAGNOSIS — L98499 Non-pressure chronic ulcer of skin of other sites with unspecified severity: Secondary | ICD-10-CM

## 2014-07-21 DIAGNOSIS — R0989 Other specified symptoms and signs involving the circulatory and respiratory systems: Secondary | ICD-10-CM | POA: Diagnosis not present

## 2014-07-21 DIAGNOSIS — D692 Other nonthrombocytopenic purpura: Secondary | ICD-10-CM

## 2014-07-21 LAB — POCT HEMOGLOBIN: HEMOGLOBIN: 15 g/dL (ref 14.1–18.1)

## 2014-07-21 MED ORDER — NAPROXEN 500 MG PO TABS: 500.0000 mg | ORAL_TABLET | Freq: Two times a day (BID) | ORAL | Status: DC

## 2014-07-21 MED ORDER — ATORVASTATIN CALCIUM 20 MG PO TABS: 20.0000 mg | ORAL_TABLET | Freq: Every day | ORAL | Status: DC

## 2014-07-21 MED ORDER — TRAZODONE HCL 50 MG PO TABS: 25.0000 mg | ORAL_TABLET | Freq: Every evening | ORAL | Status: DC | PRN

## 2014-07-21 NOTE — Progress Notes (Signed)
   Subjective:    Patient ID: Gregory Santana, male    DOB: 12/24/1940, 74 y.o.   MRN: [id number]  Hypertension This is a chronic problem. Pertinent negatives include no chest pain or headaches. Treatments tried: not currently taking any bp meds.  bp meds stopped before surgery.   Having some dizzy spells. He denies imbalance denies nausea vomiting denies headaches. Does have a history of bladder cancer.  Pt having trouble sleeping. Pt wakes after about 4 hours of sleep and then he is wide awake. Taking lorazepam 0.5mg  one qhs.   Neck pain. Pt sleeps in recliner and he thinks that might be why. Pt doesn't want to sleep in bed because the last time he slept in bed he had a stroke. Lightheaded at times for 5 min Goes away on its own Neck sore ans stiff  Review of Systems  Constitutional: Negative for activity change, appetite change and fatigue.  HENT: Negative for congestion.   Respiratory: Negative for cough.   Cardiovascular: Negative for chest pain.  Gastrointestinal: Negative for abdominal pain.  Endocrine: Negative for polydipsia and polyphagia.  Neurological: Positive for dizziness and light-headedness. Negative for weakness and headaches.  Psychiatric/Behavioral: Negative for confusion.       Objective:   Physical Exam  Constitutional: He appears well-nourished. No distress.  Cardiovascular: Normal rate, regular rhythm and normal heart sounds.   No murmur heard. Pulmonary/Chest: Effort normal and breath sounds normal. No respiratory distress.  Musculoskeletal: He exhibits no edema.  Lymphadenopathy:    He has no cervical adenopathy.  Neurological: He is alert.  Psychiatric: His behavior is normal.  Vitals reviewed.   Carotid bruit noted on the left side senile purpura noted on the arms      Assessment & Plan:  Saw urology for follow up and was told things are ok  1. Essential hypertension, benign Blood pressure under decent control continue to try to minimize  salt diet encourage patient to quit smoking  2. Senile purpura Probably related to age and aspirin use hemoglobin good  3. Insomnia Add trazodone 50 mg at night to see if this will help patient to report to [place] if ongoing troubles  4. Hyperlipemia Continue watching diet taking medication lab work looks good HDL genetically low  5. Bruit of left carotid artery Bruit in the left carotid artery we will do ultrasound - US Carotid Duplex Bilateral  6. Anemia, unspecified anemia type Patient with history of anemia hemoglobin looks good - POCT hemoglobin  7. Callous ulcer, with unspecified severity Patient has callus on the foot use petroleum jelly with salicylic acid follow-up in 2-3 weeks to have it trimmed  Tobacco abuse patient encouraged quit smoking  Intermittent dizziness could be related to the carotid issue could be related to just positional changes. No sign of stroke found on today's visit  25 minutes spent with patient discussing these multiple issues  If his symptoms persist or worsen he may need a scan of his head, he will follow-up 2-3 weeks regarding his foot we will see how his dizziness is doing at that time

## 2014-07-23 ENCOUNTER — Ambulatory Visit (HOSPITAL_COMMUNITY)
Admission: RE | Admit: 2014-07-23 | Discharge: 2014-07-23 | Disposition: A | Discharge status: Home / Self Care | Payer: Medicare PPO | Source: Ambulatory Visit | Attending: Family Medicine | Admitting: Family Medicine

## 2014-07-23 DIAGNOSIS — R0989 Other specified symptoms and signs involving the circulatory and respiratory systems: Secondary | ICD-10-CM | POA: Diagnosis present

## 2014-07-26 ENCOUNTER — Ambulatory Visit: Payer: Medicare PPO | Admitting: Family Medicine

## 2014-08-10 ENCOUNTER — Telehealth: Payer: Self-pay | Admitting: Family Medicine

## 2014-08-10 NOTE — Telephone Encounter (Signed)
[facility] to get more info

## 2014-08-10 NOTE — Telephone Encounter (Signed)
Patient was prescribed traZODone (DESYREL) 50 MG tablet and his daughter has been noticing that he can't remember things at times and is having vision issues.  Erline Levine has researched and found that these are two side effects of this medication. Erline Levine wants to know what Dr. Nicki Reaper suggests?   [facility]

## 2014-08-11 ENCOUNTER — Other Ambulatory Visit: Payer: Self-pay | Admitting: *Deleted

## 2014-08-11 NOTE — Telephone Encounter (Signed)
Pt started taking med about 2 weeks ago. Having blurry vision comes and goes. Having some confusion but had some before he started med. Now confusion is getting worse. Pt stopped med 2 days ago. Daughter thinks it may be the med because she looked up side effects of it.

## 2014-08-11 NOTE — Telephone Encounter (Signed)
Discussed with daughter. Med list updated.

## 2014-08-11 NOTE — Telephone Encounter (Signed)
D/c trazadone in epic and note this as a side effect under allergies. If confusion is related to med it should clear up over next 48 hours if persist beyond 48 then needs to be checked. Also if any severe confusion,difficulty speaking or swallowing then will need eval for possible stroke

## 2014-09-07 ENCOUNTER — Other Ambulatory Visit: Payer: Self-pay | Admitting: Family Medicine

## 2014-09-07 NOTE — Telephone Encounter (Signed)
This and 3 rf 

## 2014-11-05 ENCOUNTER — Ambulatory Visit (INDEPENDENT_AMBULATORY_CARE_PROVIDER_SITE_OTHER): Payer: Medicare PPO | Admitting: Family Medicine

## 2014-11-05 ENCOUNTER — Encounter: Payer: Self-pay | Admitting: Family Medicine

## 2014-11-05 VITALS — BP 152/90 | Ht 73.0 in | Wt 268.0 lb

## 2014-11-05 DIAGNOSIS — I10 Essential (primary) hypertension: Secondary | ICD-10-CM | POA: Diagnosis not present

## 2014-11-05 DIAGNOSIS — L409 Psoriasis, unspecified: Secondary | ICD-10-CM

## 2014-11-05 DIAGNOSIS — Z23 Encounter for immunization: Secondary | ICD-10-CM | POA: Diagnosis not present

## 2014-11-05 DIAGNOSIS — F09 Unspecified mental disorder due to known physiological condition: Secondary | ICD-10-CM

## 2014-11-05 DIAGNOSIS — C679 Malignant neoplasm of bladder, unspecified: Secondary | ICD-10-CM | POA: Diagnosis not present

## 2014-11-05 DIAGNOSIS — G4700 Insomnia, unspecified: Secondary | ICD-10-CM

## 2014-11-05 MED ORDER — METOPROLOL SUCCINATE ER 50 MG PO TB24: 50.0000 mg | ORAL_TABLET | Freq: Every day | ORAL | Status: DC

## 2014-11-05 MED ORDER — LORAZEPAM 0.5 MG PO TABS: ORAL_TABLET | ORAL | Status: DC

## 2014-11-05 NOTE — Progress Notes (Signed)
   Subjective:    Patient ID: Gregory Santana, male    DOB: 01/09/1941, 74 y.o.   MRN: [id number]  HPIpt following up on migraines. Has had migraines for years. Usually his eye twitch then the headache comes on. Takes excederin migraine. Now just having eye twitching.   Trouble with balance. Lightheaded when standing and when turning head a certain way.  Sleeps in recliner and neck is sore when he wakes up.  Flu vaccine today.  Wants to discuss lorazepam. Helps to fall asleep but doesn't sleep long.  Family relates/daughter relates that the patient had some short-term memory dysfunction also relates that patient at times forgetful although can carry on conversation with family members well. He denies any problems with taking his medicines. Review of Systems  Constitutional: Negative for activity change, appetite change and fatigue.  HENT: Negative for congestion.   Respiratory: Negative for cough.   Cardiovascular: Negative for chest pain.  Gastrointestinal: Negative for abdominal pain.  Endocrine: Negative for polydipsia and polyphagia.  Neurological: Negative for weakness.  Psychiatric/Behavioral: Negative for confusion.       Objective:   Physical Exam  Constitutional: He appears well-developed and well-nourished.  HENT:  Head: Normocephalic and atraumatic.  Right Ear: External ear normal.  Left Ear: External ear normal.  Nose: Nose normal.  Mouth/Throat: Oropharynx is clear and moist.  Eyes: EOM are normal. Pupils are equal, round, and reactive to light.  Neck: Normal range of motion. Neck supple. No thyromegaly present.  Cardiovascular: Normal rate, regular rhythm and normal heart sounds.   No murmur heard. Pulmonary/Chest: Effort normal and breath sounds normal. No respiratory distress. He has no wheezes.  Abdominal: Soft. Bowel sounds are normal. He exhibits no distension and no mass. There is no tenderness.  Genitourinary: Penis normal.  Musculoskeletal: Normal range of  motion. He exhibits no edema.  Lymphadenopathy:    He has no cervical adenopathy.  Neurological: He is alert. He exhibits normal muscle tone.  Skin: Skin is warm and dry. No erythema.  Psychiatric: He has a normal mood and affect. His behavior is normal. Judgment normal.    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 They will monitor this. Follow-up in 2 months. If frequent headaches may need scan  Cognitive dysfunction starting to have some short-term memory dysfunction over the past several months patients recall immediate 3/3 after distraction 1 for 3 clock drawing passed  Only able to name 11 animals in 60 seconds. We will need to monitor her cognitive function follow-up in 2 months I recommended to the patient looking. He [place] locally [address]. I recommend against traveling out of town against highway driving in only limited daytime driving  Blood pressure recheck 142/82. We will go ahead and bump up the dose of Toprol 80 mg daily. Recheck patient in 2 months time.  Patient will be following up with urologist regarding his renal cancer.  I encouraged patient to sleep in the bed but he is reluctant to do so at the very least he needs to lay the recliner further back area has intermittent neck pain from sleeping sitting upright  Mild intermittent insomnia recommend trying to add Benadryl to see if that might help caution drowsiness greater than 25 minutes spent with patient greater than half in discussion of multiple issues

## 2014-11-06 LAB — BASIC METABOLIC PANEL
BUN / CREAT RATIO: 10 (ref 10–22)
BUN: 15 mg/dL (ref 8–27)
CO2: 26 mmol/L (ref 18–29)
CREATININE: 1.43 mg/dL — AB (ref 0.76–1.27)
Calcium: 9.6 mg/dL (ref 8.6–10.2)
Chloride: 100 mmol/L (ref 97–108)
GFR calc non Af Amer: 49 mL/min/{1.73_m2} — ABNORMAL LOW (ref >59)
GFR, EST AFRICAN AMERICAN: 56 mL/min/{1.73_m2} — AB (ref >59)
Glucose: 81 mg/dL (ref 65–99)
Potassium: 4.8 mmol/L (ref 3.5–5.2)
Sodium: 142 mmol/L (ref 134–144)

## 2014-11-07 ENCOUNTER — Encounter: Payer: Self-pay | Admitting: Family Medicine

## 2014-12-28 ENCOUNTER — Other Ambulatory Visit: Payer: Self-pay | Admitting: Family Medicine

## 2015-01-05 ENCOUNTER — Ambulatory Visit: Payer: Medicare PPO | Admitting: Family Medicine

## 2015-03-31 ENCOUNTER — Telehealth: Payer: Self-pay | Admitting: Family Medicine

## 2015-03-31 NOTE — Telephone Encounter (Signed)
Patient needs follow-up visit in the spring

## 2015-03-31 NOTE — Telephone Encounter (Signed)
Called and spoke with Lovey Newcomer at [facility] and informed her per Dr.Scott Luking- Patient must stop aspirin approximately 6 days prior to surgery and may resume one day after as long as bleeding is under control. Sandy verbalized understanding. Card to be mailed to patient to inform him that appointment is needed.

## 2015-03-31 NOTE — Telephone Encounter (Signed)
pts going to have some extractions at [facility] to know if he needs to be off his Asprin for any length of time  For the extraction? These teeth are periodontically involved and they  Don't expect too much trouble but would like to know when to stop and  Start again for the procedure if need be?   Call [facility]  [phone]

## 2015-03-31 NOTE — Telephone Encounter (Signed)
Patient may stop aspirin approximately 6 days before the procedure. Then may resume one day later as long as bleeding is under control.

## 2015-04-07 ENCOUNTER — Other Ambulatory Visit: Payer: Self-pay | Admitting: Family Medicine

## 2015-04-08 NOTE — Telephone Encounter (Signed)
This and 2 refill needs ov this spring!!!

## 2015-04-20 ENCOUNTER — Other Ambulatory Visit: Payer: Self-pay

## 2015-04-20 MED ORDER — METOPROLOL SUCCINATE ER 50 MG PO TB24: 50.0000 mg | ORAL_TABLET | Freq: Every day | ORAL | Status: DC

## 2015-06-02 ENCOUNTER — Ambulatory Visit (INDEPENDENT_AMBULATORY_CARE_PROVIDER_SITE_OTHER): Payer: Medicare PPO | Admitting: Family Medicine

## 2015-06-02 ENCOUNTER — Encounter: Payer: Self-pay | Admitting: Family Medicine

## 2015-06-02 VITALS — BP 130/80 | Ht 73.0 in | Wt 257.1 lb

## 2015-06-02 DIAGNOSIS — C671 Malignant neoplasm of dome of bladder: Secondary | ICD-10-CM | POA: Diagnosis not present

## 2015-06-02 DIAGNOSIS — I10 Essential (primary) hypertension: Secondary | ICD-10-CM | POA: Diagnosis not present

## 2015-06-02 DIAGNOSIS — D692 Other nonthrombocytopenic purpura: Secondary | ICD-10-CM

## 2015-06-02 DIAGNOSIS — G4700 Insomnia, unspecified: Secondary | ICD-10-CM | POA: Diagnosis not present

## 2015-06-02 DIAGNOSIS — E785 Hyperlipidemia, unspecified: Secondary | ICD-10-CM | POA: Diagnosis not present

## 2015-06-02 DIAGNOSIS — L409 Psoriasis, unspecified: Secondary | ICD-10-CM | POA: Diagnosis not present

## 2015-06-02 LAB — POCT URINALYSIS DIPSTICK
PH UA: 6
RBC UA: 250

## 2015-06-02 MED ORDER — METOPROLOL SUCCINATE ER 50 MG PO TB24: 50.0000 mg | ORAL_TABLET | Freq: Every day | ORAL | Status: DC

## 2015-06-02 MED ORDER — ATORVASTATIN CALCIUM 20 MG PO TABS: 20.0000 mg | ORAL_TABLET | Freq: Every day | ORAL | Status: DC

## 2015-06-02 MED ORDER — LORAZEPAM 0.5 MG PO TABS: ORAL_TABLET | ORAL | Status: DC

## 2015-06-02 NOTE — Progress Notes (Signed)
   Subjective:    Patient ID: Gregory Santana, male    DOB: Apr 20, 1940, 75 y.o.   MRN: [id number]  Hypertension This is a chronic problem. The current episode started more than 1 year ago. The problem has been gradually improving since onset. There are no associated agents to hypertension. There are no known risk factors for coronary artery disease. Treatments tried: metoprolol. The current treatment provides moderate improvement. There are no compliance problems.    Patient states that he has no concerns at this time.  Patient denies any abdominal pain hematuria rectal bleeding nausea vomiting fever chills sweats states appetite is good takes his cholesterol medicine has not followed up with urology for bladder cancer in over a year's time states part of the reason is money patient does have some insomnia issues but medicine helps him sleep from midnight until 6 AM Patient counseled to quit smoking Review of Systems Negative for headaches negative for chest pressure tightness or pain denies shortness of breath   see above Objective:   Physical Exam Lungs are clear hearts regular abdomen soft obese psoriasis noted urinary bag noted extremities no edema  25 minutes was spent with the patient. Greater than half the time was spent in discussion and answering questions and counseling regarding the issues that the patient came in for today.      Assessment & Plan:  1. Essential hypertension, benign Blood pressure good control continue current measures - CBC with Differential/Platelet - Lipid panel - Basic metabolic panel - Hepatic function panel  2. Psoriasis I recommend using topical medication for psoriasis family states they have some at home - CBC with Differential/Platelet - Lipid panel - Basic metabolic panel - Hepatic function panel  3. Senile purpura (HCC) Related to aspirin and age. Aspirin is for stroke prevention continue aspirin. - CBC with Differential/Platelet - Lipid  panel - Basic metabolic panel - Hepatic function panel  4. 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 Lab work ordered. - CBC with Differential/Platelet - Lipid panel - Basic metabolic panel - Hepatic function panel - POCT urinalysis dipstick  5. Hyperlipemia Continue medication check lab work await the results - CBC with Differential/Platelet - Lipid panel - Basic metabolic panel - Hepatic function panel  6. Insomnia Offered trazodone for nighttime use family states they've try that before cause mental status change stick with current medication - CBC with Differential/Platelet - Lipid panel - Basic metabolic panel - Hepatic function panel Follow-up within 4-6 months.

## 2015-06-13 NOTE — Progress Notes (Signed)
So noted thank you but please either yourself or nurses plz get his last ov notes sent here for my review. plz also inform the family of your suggestion regarding his bill but if pt has no intentions of doing so to please let us know plan B is to set him up at [facility] for a follow up visit

## 2015-10-14 ENCOUNTER — Telehealth: Payer: Self-pay | Admitting: Family Medicine

## 2015-10-14 DIAGNOSIS — C679 Malignant neoplasm of bladder, unspecified: Secondary | ICD-10-CM

## 2015-10-14 DIAGNOSIS — I10 Essential (primary) hypertension: Secondary | ICD-10-CM

## 2015-10-14 DIAGNOSIS — E785 Hyperlipidemia, unspecified: Secondary | ICD-10-CM

## 2015-10-14 NOTE — Telephone Encounter (Signed)
Lipid, liver, metabolic 7, CBC-also referral to urology [facility] bladder cancer follow-up

## 2015-10-14 NOTE — Telephone Encounter (Signed)
Blood work and referral ordered in [facility]. Patient notified.

## 2015-10-14 NOTE — Telephone Encounter (Signed)
Pt requesting referral to a urologist  [facility] won't see him until the large bill is paid, pt unable to do that and would like referral to another urologist  Also pt has appt 12/02/15 here, does he need blood work   Please advise

## 2015-10-20 ENCOUNTER — Encounter: Payer: Self-pay | Admitting: Family Medicine

## 2015-10-21 ENCOUNTER — Ambulatory Visit: Payer: Medicare PPO | Admitting: Family Medicine

## 2015-11-30 ENCOUNTER — Telehealth: Payer: Self-pay | Admitting: Family Medicine

## 2015-11-30 NOTE — Telephone Encounter (Signed)
Error

## 2015-12-02 ENCOUNTER — Other Ambulatory Visit: Payer: Self-pay | Admitting: Family Medicine

## 2015-12-02 ENCOUNTER — Other Ambulatory Visit: Payer: Self-pay | Admitting: *Deleted

## 2015-12-02 ENCOUNTER — Ambulatory Visit: Payer: Medicare PPO | Admitting: Family Medicine

## 2015-12-02 ENCOUNTER — Telehealth: Payer: Self-pay | Admitting: Family Medicine

## 2015-12-02 MED ORDER — LORAZEPAM 0.5 MG PO TABS: ORAL_TABLET | ORAL | 0 refills | Status: DC

## 2015-12-02 NOTE — Telephone Encounter (Signed)
Script faxed. Tried to call all numbers in chart. No answer.

## 2015-12-02 NOTE — Telephone Encounter (Signed)
Last seen 06/02/15 for med check

## 2015-12-02 NOTE — Telephone Encounter (Signed)
1 refill, needs office visit please schedule

## 2015-12-02 NOTE — Telephone Encounter (Signed)
1 refill on all, needs office visit

## 2015-12-02 NOTE — Telephone Encounter (Signed)
Pt is needing a refill on his LORazepam (ATIVAN) 0.5 MG tablet   [facility]

## 2015-12-03 LAB — BASIC METABOLIC PANEL
BUN / CREAT RATIO: 14 (ref 10–24)
BUN: 21 mg/dL (ref 8–27)
CALCIUM: 9.8 mg/dL (ref 8.6–10.2)
CHLORIDE: 102 mmol/L (ref 96–106)
CO2: 26 mmol/L (ref 18–29)
CREATININE: 1.54 mg/dL — AB (ref 0.76–1.27)
GFR calc Af Amer: 51 mL/min/{1.73_m2} — ABNORMAL LOW (ref >59)
GFR calc non Af Amer: 44 mL/min/{1.73_m2} — ABNORMAL LOW (ref >59)
GLUCOSE: 92 mg/dL (ref 65–99)
Potassium: 4.4 mmol/L (ref 3.5–5.2)
Sodium: 142 mmol/L (ref 134–144)

## 2015-12-03 LAB — LIPID PANEL
CHOL/HDL RATIO: 3.7 ratio (ref 0.0–5.0)
Cholesterol, Total: 106 mg/dL (ref 100–199)
HDL: 29 mg/dL — AB (ref >39)
LDL CALC: 49 mg/dL (ref 0–99)
Triglycerides: 142 mg/dL (ref 0–149)
VLDL Cholesterol Cal: 28 mg/dL (ref 5–40)

## 2015-12-03 LAB — CBC WITH DIFFERENTIAL/PLATELET
Basophils Absolute: 0 10*3/uL (ref 0.0–0.2)
Basos: 0 %
EOS (ABSOLUTE): 0.2 10*3/uL (ref 0.0–0.4)
Eos: 2 %
Hematocrit: 45 % (ref 37.5–51.0)
Hemoglobin: 15.7 g/dL (ref 12.6–17.7)
Immature Grans (Abs): 0 10*3/uL (ref 0.0–0.1)
Immature Granulocytes: 1 %
Lymphocytes Absolute: 1.8 10*3/uL (ref 0.7–3.1)
Lymphs: 21 %
MCH: 32.3 pg (ref 26.6–33.0)
MCHC: 34.9 g/dL (ref 31.5–35.7)
MCV: 93 fL (ref 79–97)
Monocytes Absolute: 0.7 10*3/uL (ref 0.1–0.9)
Monocytes: 8 %
Neutrophils Absolute: 6.1 10*3/uL (ref 1.4–7.0)
Neutrophils: 68 %
Platelets: 188 10*3/uL (ref 150–379)
RBC: 4.86 x10E6/uL (ref 4.14–5.80)
RDW: 12.9 % (ref 12.3–15.4)
WBC: 8.8 10*3/uL (ref 3.4–10.8)

## 2015-12-03 LAB — HEPATIC FUNCTION PANEL
ALT: 17 IU/L (ref 0–44)
AST: 16 IU/L (ref 0–40)
Albumin: 4.4 g/dL (ref 3.5–4.8)
Alkaline Phosphatase: 81 IU/L (ref 39–117)
BILIRUBIN, DIRECT: 0.22 mg/dL (ref 0.00–0.40)
Bilirubin Total: 0.7 mg/dL (ref 0.0–1.2)
TOTAL PROTEIN: 7.6 g/dL (ref 6.0–8.5)

## 2015-12-07 ENCOUNTER — Ambulatory Visit: Payer: Medicare PPO | Admitting: Family Medicine

## 2015-12-08 NOTE — Telephone Encounter (Signed)
Patient has appointment scheduled for follow up with Dr Nicki Reaper 12/21/15

## 2015-12-21 ENCOUNTER — Ambulatory Visit (INDEPENDENT_AMBULATORY_CARE_PROVIDER_SITE_OTHER): Payer: Medicare PPO | Admitting: Family Medicine

## 2015-12-21 ENCOUNTER — Encounter: Payer: Self-pay | Admitting: Family Medicine

## 2015-12-21 VITALS — BP 128/68 | Ht 73.0 in | Wt 246.2 lb

## 2015-12-21 DIAGNOSIS — E784 Other hyperlipidemia: Secondary | ICD-10-CM

## 2015-12-21 DIAGNOSIS — I10 Essential (primary) hypertension: Secondary | ICD-10-CM

## 2015-12-21 DIAGNOSIS — E7849 Other hyperlipidemia: Secondary | ICD-10-CM

## 2015-12-21 DIAGNOSIS — G4700 Insomnia, unspecified: Secondary | ICD-10-CM | POA: Diagnosis not present

## 2015-12-21 DIAGNOSIS — L409 Psoriasis, unspecified: Secondary | ICD-10-CM

## 2015-12-21 MED ORDER — LORAZEPAM 1 MG PO TABS: ORAL_TABLET | ORAL | 5 refills | Status: DC

## 2015-12-21 MED ORDER — METOPROLOL SUCCINATE ER 50 MG PO TB24: 50.0000 mg | ORAL_TABLET | Freq: Every day | ORAL | 5 refills | Status: DC

## 2015-12-21 MED ORDER — ATORVASTATIN CALCIUM 20 MG PO TABS: 20.0000 mg | ORAL_TABLET | Freq: Every day | ORAL | 5 refills | Status: DC

## 2015-12-21 NOTE — Progress Notes (Signed)
   Subjective:    Patient ID: Gregory Santana, male    DOB: 1940-06-02, 75 y.o.   MRN: [id number]  Hypertension  This is a chronic problem. The current episode started more than 1 year ago. Risk factors for coronary artery disease include dyslipidemia, sedentary lifestyle and male gender. Treatments tried: metoprolol.   Patient would like to discuss Ativan-doesn't seem to be working like it used too- waking up at night even with adding the benadryl This patient has a history of bladder cancer. He has a ileostomy bag that helps him with urination. He denies any fever chills sweats denies hematuria denies rectal bleeding. Patient still smokes he knows he needs to quit He typically will eat in the morning and eat at supper but often just snack at noontime. Does not do much walking. Patient uses lorazepam at nighttime helps him sleep a few hours but he feels like he doesn't do as well he denies excessive drowsiness from it Patient does take his metoprolol He does uses cholesterol medicines on a regular basis Patient does relate intermittent headaches that are not intractable. Seem to get triggered by sunlight shines in his face He denies any major issues with this. Does not wake him up at night no vomiting with it. Happens maybe once every 5-8 days and is very minor typically goes away with an Excedrin Migraine Review of Systems See descriptions above Denies chest pressure tightness pain shortness of breath Denies rectal bleeding hematuria.    Objective:   Physical Exam Lungs are clear no crackles heart is regular neck no masses HEENT is benign extremities trace edema mild psoriasis noted. Abdomen soft.       Assessment & Plan:  History of bladder cancer. Very important for this patient to follow-up with urology as an appointment in December with a specialist  Blood pressure good control continue current measures  Infrequent headaches seemingly doing well with what he is using but if  they should become more severe cause him to wake up in a mill night he needs a follow-up immediately  Hyperlipidemia continue current medications. Lab work reviewed with the patient  Stroke prevention the importance keeping blood pressure cholesterol under control continue aspirin.  Smoking-patient was counseled to quit smoking it is unlikely he will do so  Psoriasis use lotions on a regular basis patient has not 1 and steroid creams in the past  Follow-up in approximately 5-6 months sooner problems patient was counseled regarding if he starts having anything more than a simple cold he needs to get checked out  Patient has deferred on colonoscopy. The patient

## 2016-05-14 ENCOUNTER — Emergency Department (HOSPITAL_COMMUNITY)
Admission: EM | Admit: 2016-05-14 | Discharge: 2016-05-14 | Disposition: A | Discharge status: Home / Self Care | Payer: Medicare PPO | Attending: Emergency Medicine | Admitting: Emergency Medicine

## 2016-05-14 ENCOUNTER — Encounter (HOSPITAL_COMMUNITY): Payer: Self-pay | Admitting: Emergency Medicine

## 2016-05-14 DIAGNOSIS — Y9289 Other specified places as the place of occurrence of the external cause: Secondary | ICD-10-CM | POA: Diagnosis not present

## 2016-05-14 DIAGNOSIS — Z7982 Long term (current) use of aspirin: Secondary | ICD-10-CM | POA: Insufficient documentation

## 2016-05-14 DIAGNOSIS — Y939 Activity, unspecified: Secondary | ICD-10-CM | POA: Insufficient documentation

## 2016-05-14 DIAGNOSIS — Y999 Unspecified external cause status: Secondary | ICD-10-CM | POA: Insufficient documentation

## 2016-05-14 DIAGNOSIS — S61451A Open bite of right hand, initial encounter: Secondary | ICD-10-CM | POA: Insufficient documentation

## 2016-05-14 DIAGNOSIS — W540XXA Bitten by dog, initial encounter: Secondary | ICD-10-CM | POA: Diagnosis not present

## 2016-05-14 DIAGNOSIS — S51851A Open bite of right forearm, initial encounter: Secondary | ICD-10-CM | POA: Insufficient documentation

## 2016-05-14 DIAGNOSIS — I10 Essential (primary) hypertension: Secondary | ICD-10-CM | POA: Diagnosis not present

## 2016-05-14 DIAGNOSIS — F17210 Nicotine dependence, cigarettes, uncomplicated: Secondary | ICD-10-CM | POA: Insufficient documentation

## 2016-05-14 DIAGNOSIS — Z23 Encounter for immunization: Secondary | ICD-10-CM | POA: Insufficient documentation

## 2016-05-14 MED ORDER — TETANUS-DIPHTH-ACELL PERTUSSIS 5-2.5-18.5 LF-MCG/0.5 IM SUSP
0.5000 mL | Freq: Once | INTRAMUSCULAR | Status: AC
  Administered 2016-05-14: 0.5 mL via INTRAMUSCULAR
  Filled 2016-05-14: qty 0.5

## 2016-05-14 MED ORDER — AMOXICILLIN-POT CLAVULANATE 875-125 MG PO TABS: 1.0000 | ORAL_TABLET | Freq: Two times a day (BID) | ORAL | 0 refills | Status: DC

## 2016-05-14 MED ORDER — AMOXICILLIN-POT CLAVULANATE 875-125 MG PO TABS
1.0000 | ORAL_TABLET | Freq: Once | ORAL | Status: AC
  Administered 2016-05-14: 1 via ORAL
  Filled 2016-05-14: qty 1

## 2016-05-14 NOTE — ED Provider Notes (Signed)
[facility] DEPT Provider Note   CSN: [id number] Arrival date & time: 05/14/16  1251     History   Chief Complaint Chief Complaint  Patient presents with  . Animal Bite    HPI Gregory Santana is a 76 y.o. male.  HPI   76 year old male presented for evaluation of dog bites to his right upper extremity. Incident happened 2 days ago. Animal was his dog. He reports that his immunizations are up-to-date. He has been multiple times in the right hand and forearm. He is presenting now is his daughter felt to be evaluated. He has developed some swelling of his right hand. Mild pain over the dorsum of the right hand. No drainage. No fevers or chills. He is not a diabetic. He is unsure of his tetanus status. He has been doing simple dressing changes at home.  Past Medical History:  Diagnosis Date  . Cancer ([place])    bladder  . Headache(784.0)    migraines  . Hyperlipidemia   . Hypertension   . Psoriasis 1/15   abdomen, sides of chest  . Stroke [facility]) 2002   no deficits    Patient Active Problem List   Diagnosis Date Noted  . Senile purpura ([place]) 07/21/2014  . Insomnia 07/21/2014  . Cardiomyopathy ([place]) 03/20/2013  . Bladder cancer ([place]) 01/21/2013  . Hyperlipemia 07/02/2012  . Carotid bruit present 07/02/2012  . Essential hypertension, benign 07/02/2012  . Psoriasis 07/02/2012    Past Surgical History:  Procedure Laterality Date  . CYSTOGRAM N/A 02/06/2013   Procedure: CYSTOGRAM;  Surgeon: Alexis Frock, MD;  Location: [facility] ORS;  Service: Urology;  Laterality: N/A;  . CYSTOSCOPY W/ URETERAL STENT PLACEMENT Right 02/06/2013   Procedure: CYSTOSCOPY WITH RIGHT RETROGRADE PYELOGRAM/RIGHT URETERAL STENT PLACEMENT;  Surgeon: Alexis Frock, MD;  Location: [facility];  Service: Urology;  Laterality: Right;  . HERNIA REPAIR Left    inguinal  . ROBOT ASSISTED LAPAROSCOPIC COMPLETE CYSTECT ILEAL CONDUIT N/A 03/25/2013   Procedure: ROBOTIC ASSISTED LAPAROSCOPIC COMPLETE CYSTECT ILEAL  CONDUIT, EXTENSIVE ADHESIOLYSIS;  Surgeon: Alexis Frock, MD;  Location: [facility];  Service: Urology;  Laterality: N/A;  . TRANSURETHRAL RESECTION OF BLADDER TUMOR WITH GYRUS (TURBT-GYRUS) N/A 02/06/2013   Procedure: TRANSURETHRAL RESECTION OF BLADDER TUMOR WITH GYRUS (TURBT-GYRUS);  Surgeon: Alexis Frock, MD;  Location: [facility];  Service: Urology;  Laterality: N/A;       Home Medications    Prior to Admission medications   Medication Sig Start Date End Date Taking? Authorizing Provider  aspirin 325 MG EC tablet Take 325 mg by mouth daily.    Historical Provider, MD  atorvastatin (LIPITOR) 20 MG tablet Take 1 tablet (20 mg total) by mouth daily. 12/21/15   Kathyrn Drown, MD  LORazepam (ATIVAN) 1 MG tablet Take 1 tablet every evening prn anxietry 12/21/15   Kathyrn Drown, MD  metoprolol succinate (TOPROL-XL) 50 MG 24 hr tablet Take 1 tablet (50 mg total) by mouth daily. Take with or immediately following a meal. 12/21/15   Kathyrn Drown, MD    Family History Family History  Problem Relation Age of Onset  . Hypertension Mother     Social History Social History  Substance Use Topics  . Smoking status: Current Every Day Smoker    Packs/day: 1.50    Years: 50.00    Types: Cigarettes    Start date: 04/17/1957  . Smokeless tobacco: Never Used  . Alcohol use No     Allergies   Trazodone and nefazodone  Review of Systems Review of Systems  All systems reviewed and negative, other than as noted in HPI.  Physical Exam Updated Vital Signs BP (!) 145/72 (BP Location: Left Arm)   Pulse 80   Temp 97.7 F (36.5 C) (Oral)   Resp 18   Ht 6\' 1"  (1.854 m)   Wt 225 lb (102.1 kg)   SpO2 98%   BMI 29.69 kg/m   Physical Exam  Constitutional: He appears well-developed and well-nourished. No distress.  HENT:  Head: Normocephalic and atraumatic.  Eyes: Conjunctivae are normal. Right eye exhibits no discharge. Left eye exhibits no discharge.  Neck: Neck supple.  Cardiovascular:  Normal rate, regular rhythm and normal heart sounds.  Exam reveals no gallop and no friction rub.   No murmur heard. Pulmonary/Chest: Effort normal and breath sounds normal. No respiratory distress.  Abdominal: Soft. He exhibits no distension. There is no tenderness.  Musculoskeletal: He exhibits no edema or tenderness.  Multiple wounds to the right hand and forearm. Abrasions, puncture wounds and a ragged laceration over the dorsum of the hand. Wound of both volar and dorsal surfaces. Most of them look ok. He does have some mild erythema around a puncture wound to the thenar eminence. No drainage. Mild diffuse swelling of the hand. He also has some erythema and minimal serous drainage from the hand laceration. He can actively flex and extend all digits against resistance. Good range of motion at the wrist. Sensation is intact to light touch in all fingertips. Good cap refill.  Neurological: He is alert.  Skin: Skin is warm and dry.  Psychiatric: He has a normal mood and affect. His behavior is normal. Thought content normal.  Nursing note and vitals reviewed.    ED Treatments / Results  Labs (all labs ordered are listed, but only abnormal results are displayed) Labs Reviewed - No data to display  EKG  EKG Interpretation None       Radiology No results found.  Procedures Procedures (including critical care time)  Medications Ordered in ED Medications  Tdap (BOOSTRIX) injection 0.5 mL (not administered)  amoxicillin-clavulanate (AUGMENTIN) 875-125 MG per tablet 1 tablet (not administered)     Initial Impression / Assessment and Plan / ED Course  I have reviewed the triage vital signs and the nursing notes.  Pertinent labs & imaging results that were available during my care of the patient were reviewed by me and considered in my medical decision making (see chart for details).  65y male with dog bite to right upper extremity. Update tetanus. Continued wound care was  discussed. Will place him on antibiotics. He does have some erythema and swelling around some of the wounds. At this time though, I feel he is appropriate for outpatient treatment. Return precautions discussed.   Final Clinical Impressions(s) / ED Diagnoses   Final diagnoses:  Dog bite, initial encounter    New Prescriptions New Prescriptions   No medications on file     Virgel Manifold, MD 05/16/16 1207

## 2016-05-14 NOTE — ED Triage Notes (Signed)
Pt reports his dog bit him on the right arm yesterday over a doughnut.  Dog is UTD on his shots.  Redness and swelling noted around bites to hand.

## 2016-05-22 ENCOUNTER — Ambulatory Visit: Payer: Medicare PPO | Admitting: Family Medicine

## 2016-05-24 ENCOUNTER — Encounter: Payer: Self-pay | Admitting: Family Medicine

## 2016-06-29 ENCOUNTER — Other Ambulatory Visit: Payer: Self-pay | Admitting: Family Medicine

## 2016-07-30 ENCOUNTER — Other Ambulatory Visit: Payer: Self-pay | Admitting: Family Medicine

## 2016-07-30 NOTE — Telephone Encounter (Signed)
One month on each- need to needs office

## 2016-07-31 ENCOUNTER — Other Ambulatory Visit: Payer: Self-pay | Admitting: Family Medicine

## 2016-08-13 LAB — HM DIABETES EYE EXAM

## 2016-08-15 ENCOUNTER — Encounter: Payer: Self-pay | Admitting: *Deleted

## 2016-09-01 ENCOUNTER — Other Ambulatory Visit: Payer: Self-pay | Admitting: Family Medicine

## 2016-09-03 ENCOUNTER — Other Ambulatory Visit: Payer: Self-pay | Admitting: *Deleted

## 2016-09-03 ENCOUNTER — Telehealth: Payer: Self-pay | Admitting: Family Medicine

## 2016-09-03 DIAGNOSIS — Z79899 Other long term (current) drug therapy: Secondary | ICD-10-CM

## 2016-09-03 DIAGNOSIS — N289 Disorder of kidney and ureter, unspecified: Secondary | ICD-10-CM

## 2016-09-03 DIAGNOSIS — E785 Hyperlipidemia, unspecified: Secondary | ICD-10-CM

## 2016-09-03 DIAGNOSIS — I10 Essential (primary) hypertension: Secondary | ICD-10-CM

## 2016-09-03 MED ORDER — ATORVASTATIN CALCIUM 20 MG PO TABS: 20.0000 mg | ORAL_TABLET | Freq: Every day | ORAL | 0 refills | Status: DC

## 2016-09-03 MED ORDER — LORAZEPAM 1 MG PO TABS: ORAL_TABLET | ORAL | 0 refills | Status: DC

## 2016-09-03 NOTE — Telephone Encounter (Signed)
Last med check up 11/2015

## 2016-09-03 NOTE — Telephone Encounter (Signed)
Patient is needing Rx for Lipitor and lorazepam to [facility].  He has a followup appointment on 09/26/16.

## 2016-09-03 NOTE — Telephone Encounter (Signed)
May have one refill each

## 2016-09-03 NOTE — Telephone Encounter (Signed)
Orders put in. See other phone message from today.

## 2016-09-03 NOTE — Telephone Encounter (Signed)
Left message to return call. 30 day supply sent to pharm and bw orders put in ( see other message from today) pt needs to do bw and schedule office visit.

## 2016-09-03 NOTE — Telephone Encounter (Signed)
Patient needs CBC, metabolic 7, lipid, liver-renal insufficiency hypertension hyperlipidemia

## 2016-09-04 NOTE — Telephone Encounter (Signed)
Pt's daughter notified. 30 day supply sent to pharm and he needs bw before coming to appt at the end of the month

## 2016-09-12 ENCOUNTER — Telehealth: Payer: Self-pay | Admitting: Family Medicine

## 2016-09-12 NOTE — Telephone Encounter (Signed)
Pt's daughter called stating that she is wanting Dr. Nicki Reaper to consider putting the pt on something for anxiety. Daughter states that the pt worries about everything during the day. Daughter will be here at the appt but doesn't want to mention this in front of him.

## 2016-09-12 NOTE — Telephone Encounter (Signed)
Ok-I will touch base on this area when the patient follows up

## 2016-09-22 LAB — CBC WITH DIFFERENTIAL/PLATELET
BASOS: 0 %
Basophils Absolute: 0 10*3/uL (ref 0.0–0.2)
EOS (ABSOLUTE): 0.3 10*3/uL (ref 0.0–0.4)
EOS: 3 %
HEMATOCRIT: 45.1 % (ref 37.5–51.0)
HEMOGLOBIN: 15.3 g/dL (ref 13.0–17.7)
Immature Grans (Abs): 0 10*3/uL (ref 0.0–0.1)
Immature Granulocytes: 0 %
LYMPHS ABS: 2.3 10*3/uL (ref 0.7–3.1)
Lymphs: 22 %
MCH: 31.9 pg (ref 26.6–33.0)
MCHC: 33.9 g/dL (ref 31.5–35.7)
MCV: 94 fL (ref 79–97)
MONOCYTES: 6 %
Monocytes Absolute: 0.7 10*3/uL (ref 0.1–0.9)
NEUTROS ABS: 7.4 10*3/uL — AB (ref 1.4–7.0)
Neutrophils: 69 %
Platelets: 212 10*3/uL (ref 150–379)
RBC: 4.8 x10E6/uL (ref 4.14–5.80)
RDW: 13.8 % (ref 12.3–15.4)
WBC: 10.6 10*3/uL (ref 3.4–10.8)

## 2016-09-22 LAB — HEPATIC FUNCTION PANEL
ALBUMIN: 4.4 g/dL (ref 3.5–4.8)
ALT: 13 IU/L (ref 0–44)
AST: 13 IU/L (ref 0–40)
Alkaline Phosphatase: 94 IU/L (ref 39–117)
BILIRUBIN TOTAL: 0.5 mg/dL (ref 0.0–1.2)
Bilirubin, Direct: 0.19 mg/dL (ref 0.00–0.40)
Total Protein: 7.7 g/dL (ref 6.0–8.5)

## 2016-09-22 LAB — BASIC METABOLIC PANEL
BUN / CREAT RATIO: 13 (ref 10–24)
BUN: 19 mg/dL (ref 8–27)
CO2: 21 mmol/L (ref 20–29)
CREATININE: 1.52 mg/dL — AB (ref 0.76–1.27)
Calcium: 9.6 mg/dL (ref 8.6–10.2)
Chloride: 107 mmol/L — ABNORMAL HIGH (ref 96–106)
GFR calc Af Amer: 51 mL/min/{1.73_m2} — ABNORMAL LOW (ref >59)
GFR, EST NON AFRICAN AMERICAN: 44 mL/min/{1.73_m2} — AB (ref >59)
Glucose: 100 mg/dL — ABNORMAL HIGH (ref 65–99)
Potassium: 5.2 mmol/L (ref 3.5–5.2)
SODIUM: 144 mmol/L (ref 134–144)

## 2016-09-22 LAB — LIPID PANEL
CHOL/HDL RATIO: 3.3 ratio (ref 0.0–5.0)
Cholesterol, Total: 103 mg/dL (ref 100–199)
HDL: 31 mg/dL — ABNORMAL LOW (ref >39)
LDL CALC: 46 mg/dL (ref 0–99)
Triglycerides: 129 mg/dL (ref 0–149)
VLDL CHOLESTEROL CAL: 26 mg/dL (ref 5–40)

## 2016-09-26 ENCOUNTER — Ambulatory Visit (INDEPENDENT_AMBULATORY_CARE_PROVIDER_SITE_OTHER): Payer: Medicare PPO | Admitting: Family Medicine

## 2016-09-26 ENCOUNTER — Encounter: Payer: Self-pay | Admitting: Family Medicine

## 2016-09-26 VITALS — BP 120/74 | Ht 73.0 in | Wt 229.5 lb

## 2016-09-26 DIAGNOSIS — R05 Cough: Secondary | ICD-10-CM | POA: Diagnosis not present

## 2016-09-26 DIAGNOSIS — G4700 Insomnia, unspecified: Secondary | ICD-10-CM

## 2016-09-26 DIAGNOSIS — Z1211 Encounter for screening for malignant neoplasm of colon: Secondary | ICD-10-CM

## 2016-09-26 DIAGNOSIS — I10 Essential (primary) hypertension: Secondary | ICD-10-CM | POA: Diagnosis not present

## 2016-09-26 DIAGNOSIS — D692 Other nonthrombocytopenic purpura: Secondary | ICD-10-CM | POA: Diagnosis not present

## 2016-09-26 DIAGNOSIS — E7849 Other hyperlipidemia: Secondary | ICD-10-CM

## 2016-09-26 DIAGNOSIS — E784 Other hyperlipidemia: Secondary | ICD-10-CM | POA: Diagnosis not present

## 2016-09-26 DIAGNOSIS — R059 Cough, unspecified: Secondary | ICD-10-CM

## 2016-09-26 MED ORDER — ATORVASTATIN CALCIUM 20 MG PO TABS: 20.0000 mg | ORAL_TABLET | Freq: Every day | ORAL | 5 refills | Status: AC

## 2016-09-26 MED ORDER — METOPROLOL SUCCINATE ER 50 MG PO TB24: 50.0000 mg | ORAL_TABLET | Freq: Every day | ORAL | 5 refills | Status: AC

## 2016-09-26 MED ORDER — LORAZEPAM 0.5 MG PO TABS: ORAL_TABLET | ORAL | 5 refills | Status: DC

## 2016-09-26 MED ORDER — SERTRALINE HCL 50 MG PO TABS: ORAL_TABLET | ORAL | 4 refills | Status: AC

## 2016-09-26 NOTE — Progress Notes (Signed)
   Subjective:    Patient ID: Gregory Santana, male    DOB: March 07, 1940, 76 y.o.   MRN: [id number]  Hypertension  This is a chronic problem. The current episode started more than 1 year ago. Pertinent negatives include no chest pain, headaches or neck pain.   Multiple different issues going on Has long-standing history high blood pressure for years Moderate obesity been losing some weight because of poor dentition He denies any rectal bleeding does need a colonoscopy but doesn't really want to do 1 He does suffer with mild-to-moderate anxiety issues Also has hyperlipidemia issues that a while since she's had any lab work takes his medicine previous labs reviewed Gets occasional cough but not bad he still a smoker he knows he needs to quit but he has not quit  Patient states no other concerns this visit.   Review of Systems  Constitutional: Negative for activity change, appetite change and fever.  HENT: Negative for congestion and rhinorrhea.   Eyes: Negative for discharge.  Respiratory: Negative for cough and wheezing.   Cardiovascular: Negative for chest pain.  Gastrointestinal: Negative for abdominal pain, blood in stool and vomiting.  Genitourinary: Negative for difficulty urinating and frequency.  Musculoskeletal: Negative for neck pain.  Skin: Negative for rash.  Allergic/Immunologic: Negative for environmental allergies and food allergies.  Neurological: Negative for weakness and headaches.  Psychiatric/Behavioral: Negative for agitation.       Objective:   Physical Exam  Constitutional: He appears well-developed and well-nourished.  HENT:  Head: Normocephalic and atraumatic.  Right Ear: External ear normal.  Left Ear: External ear normal.  Nose: Nose normal.  Mouth/Throat: Oropharynx is clear and moist.  Eyes: Pupils are equal, round, and reactive to light. EOM are normal.  Neck: Normal range of motion. Neck supple. No thyromegaly present.  Cardiovascular: Normal  rate, regular rhythm and normal heart sounds.   No murmur heard. Pulmonary/Chest: Effort normal and breath sounds normal. No respiratory distress. He has no wheezes.  Abdominal: Soft. Bowel sounds are normal. He exhibits no distension and no mass. There is no tenderness.  Genitourinary: Penis normal.  Musculoskeletal: Normal range of motion. He exhibits no edema.  Lymphadenopathy:    He has no cervical adenopathy.  Neurological: He is alert. He exhibits normal muscle tone.  Skin: Skin is warm and dry. No erythema.  Psychiatric: He has a normal mood and affect. His behavior is normal. Judgment normal.   Senile purpura on the arms   25 minutes was spent with the patient. Greater than half the time was spent in discussion and answering questions and counseling regarding the issues that the patient came in for today.     Assessment & Plan:  HTN good control continue current measures watch diet Edison's review Senile purpura on the arms stable Insomnia uses lorazepam 0.5 mg daily at bedtime Hyperlipidemia previous labs reviewed Sort her continue medication Occasional cough patient does smoke he is making encouraged quit chest x-ray ordered. Weight loss due to poor dentition encourage patient to eat a soft diet try to get his teeth fixed Follow-up in 3 months to check weight Screening for colon cancer with stool tests for blood patient does not want to do colonoscopy Patient with generalized anxiety will try low-dose Zoloft 50 mg one half tablet daily if side effect problems follow-up

## 2016-10-22 ENCOUNTER — Telehealth: Payer: Self-pay | Admitting: Family Medicine

## 2016-10-22 MED ORDER — LORAZEPAM 1 MG PO TABS: 1.0000 mg | ORAL_TABLET | Freq: Every evening | ORAL | 2 refills | Status: DC | PRN

## 2016-10-22 NOTE — Telephone Encounter (Signed)
Please change his medication to lorazepam 1 mg daily at bedtime when necessary insomnia, #30, 2 refills

## 2016-10-22 NOTE — Telephone Encounter (Signed)
Prescription faxed to pharmacy. Patient notified. 

## 2016-10-22 NOTE — Telephone Encounter (Signed)
Patient had his Lorazepam changed to taking just 0.5 mg at his last visit with Dr. Nicki Reaper.  He said that this is not helping him anymore, and he has been taking 2 pills in the evening to help him sleep.  His prescription has ran out and he is not due for a refill.  Patients daughter wants to know if we can send over new Rx for Lorazepam for 1 mg daily.   [facility]

## 2016-12-26 ENCOUNTER — Ambulatory Visit: Payer: Medicare PPO | Admitting: Family Medicine

## 2017-01-09 NOTE — Telephone Encounter (Signed)
error 

## 2017-01-15 ENCOUNTER — Other Ambulatory Visit: Payer: Self-pay | Admitting: Family Medicine

## 2017-01-15 NOTE — Telephone Encounter (Signed)
Back in November I recommended that the patient be seen in 3 months.  He is delinquent on this.  Patient may have 1 refill needs office visit please send notification to the patient to schedule office visit

## 2017-01-30 ENCOUNTER — Other Ambulatory Visit: Payer: Self-pay | Admitting: Family Medicine

## 2017-01-30 NOTE — Telephone Encounter (Signed)
Refill this script at 15 tablets

## 2017-01-31 ENCOUNTER — Ambulatory Visit: Payer: Medicare PPO | Admitting: Family Medicine

## 2017-02-01 ENCOUNTER — Ambulatory Visit: Payer: Medicare PPO | Admitting: Family Medicine

## 2017-02-07 ENCOUNTER — Ambulatory Visit: Payer: Medicare PPO | Admitting: Family Medicine

## 2017-02-15 ENCOUNTER — Ambulatory Visit: Payer: Medicare PPO | Admitting: Family Medicine

## 2017-03-01 DEATH — deceased
